# Patient Record
Sex: Female | Born: 1985
Health system: Southern US, Community
[De-identification: ages and names within clinical notes are randomized; demographics above are authoritative.]

## PROBLEM LIST (undated history)

## (undated) DIAGNOSIS — R519 Headache, unspecified: Secondary | ICD-10-CM

## (undated) DIAGNOSIS — R51 Headache: Secondary | ICD-10-CM

## (undated) DIAGNOSIS — O139 Gestational [pregnancy-induced] hypertension without significant proteinuria, unspecified trimester: Secondary | ICD-10-CM

## (undated) DIAGNOSIS — O24419 Gestational diabetes mellitus in pregnancy, unspecified control: Secondary | ICD-10-CM

## (undated) DIAGNOSIS — G43909 Migraine, unspecified, not intractable, without status migrainosus: Secondary | ICD-10-CM

## (undated) DIAGNOSIS — D649 Anemia, unspecified: Secondary | ICD-10-CM

## (undated) DIAGNOSIS — R569 Unspecified convulsions: Secondary | ICD-10-CM

## (undated) DIAGNOSIS — J45909 Unspecified asthma, uncomplicated: Secondary | ICD-10-CM

## (undated) HISTORY — DX: Migraine, unspecified, not intractable, without status migrainosus: G43.909

## (undated) HISTORY — DX: Gestational diabetes mellitus in pregnancy, unspecified control: O24.419

---
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
## 2006-01-23 ENCOUNTER — Ambulatory Visit: Payer: Self-pay | Admitting: Family Medicine

## 2006-01-26 ENCOUNTER — Other Ambulatory Visit: Payer: Self-pay

## 2006-01-26 ENCOUNTER — Emergency Department: Payer: Self-pay | Admitting: Emergency Medicine

## 2006-01-31 ENCOUNTER — Ambulatory Visit: Payer: Self-pay | Admitting: Psychiatry

## 2006-10-12 ENCOUNTER — Emergency Department: Payer: Self-pay | Admitting: Emergency Medicine

## 2006-11-06 ENCOUNTER — Encounter: Payer: Self-pay | Admitting: Obstetrics and Gynecology

## 2006-11-15 ENCOUNTER — Emergency Department: Payer: Self-pay | Admitting: Emergency Medicine

## 2006-11-29 ENCOUNTER — Observation Stay: Payer: Self-pay

## 2006-12-25 ENCOUNTER — Encounter: Payer: Self-pay | Admitting: Maternal & Fetal Medicine

## 2007-01-15 ENCOUNTER — Encounter: Payer: Self-pay | Admitting: Maternal & Fetal Medicine

## 2007-04-13 ENCOUNTER — Observation Stay: Payer: Self-pay | Admitting: Obstetrics & Gynecology

## 2007-05-19 ENCOUNTER — Inpatient Hospital Stay: Payer: Self-pay | Admitting: Obstetrics & Gynecology

## 2008-03-11 ENCOUNTER — Emergency Department: Payer: Self-pay | Admitting: Unknown Physician Specialty

## 2008-05-04 ENCOUNTER — Emergency Department: Payer: Self-pay | Admitting: Emergency Medicine

## 2013-08-11 ENCOUNTER — Emergency Department: Payer: Self-pay | Admitting: Emergency Medicine

## 2013-08-11 LAB — URINALYSIS, COMPLETE
Bilirubin,UR: NEGATIVE
Glucose,UR: NEGATIVE mg/dL (ref 0–75)
KETONE: NEGATIVE
Nitrite: NEGATIVE
Ph: 6 (ref 4.5–8.0)
Protein: NEGATIVE
SPECIFIC GRAVITY: 1.012 (ref 1.003–1.030)
Squamous Epithelial: 12
WBC UR: 11 /HPF (ref 0–5)

## 2013-08-11 LAB — CBC
HCT: 47.9 % — ABNORMAL HIGH (ref 35.0–47.0)
HGB: 15.8 g/dL (ref 12.0–16.0)
MCH: 28.8 pg (ref 26.0–34.0)
MCHC: 33 g/dL (ref 32.0–36.0)
MCV: 87 fL (ref 80–100)
Platelet: 335 10*3/uL (ref 150–440)
RBC: 5.48 10*6/uL — ABNORMAL HIGH (ref 3.80–5.20)
RDW: 13.6 % (ref 11.5–14.5)
WBC: 14.6 10*3/uL — ABNORMAL HIGH (ref 3.6–11.0)

## 2013-08-11 LAB — COMPREHENSIVE METABOLIC PANEL
AST: 26 U/L (ref 15–37)
Albumin: 4.1 g/dL (ref 3.4–5.0)
Alkaline Phosphatase: 96 U/L
Anion Gap: 3 — ABNORMAL LOW (ref 7–16)
BILIRUBIN TOTAL: 0.3 mg/dL (ref 0.2–1.0)
BUN: 7 mg/dL (ref 7–18)
CO2: 29 mmol/L (ref 21–32)
Calcium, Total: 9.3 mg/dL (ref 8.5–10.1)
Chloride: 106 mmol/L (ref 98–107)
Creatinine: 0.91 mg/dL (ref 0.60–1.30)
EGFR (Non-African Amer.): >60
Glucose: 72 mg/dL (ref 65–99)
Osmolality: 272 (ref 275–301)
POTASSIUM: 3.5 mmol/L (ref 3.5–5.1)
SGPT (ALT): 15 U/L (ref 12–78)
Sodium: 138 mmol/L (ref 136–145)
Total Protein: 8 g/dL (ref 6.4–8.2)

## 2013-08-11 LAB — CK TOTAL AND CKMB (NOT AT ARMC)
CK, Total: 69 U/L
CK-MB: <0.5 ng/mL — ABNORMAL LOW (ref 0.5–3.6)

## 2013-08-11 LAB — TROPONIN I

## 2014-04-15 ENCOUNTER — Encounter (HOSPITAL_COMMUNITY): Payer: Self-pay | Admitting: Emergency Medicine

## 2014-04-15 ENCOUNTER — Emergency Department (HOSPITAL_COMMUNITY): Payer: No Typology Code available for payment source

## 2014-04-15 ENCOUNTER — Emergency Department (HOSPITAL_COMMUNITY)
Admission: EM | Admit: 2014-04-15 | Discharge: 2014-04-15 | Disposition: A | Discharge status: Home / Self Care | Payer: Self-pay | Attending: Emergency Medicine | Admitting: Emergency Medicine

## 2014-04-15 DIAGNOSIS — Y9389 Activity, other specified: Secondary | ICD-10-CM | POA: Insufficient documentation

## 2014-04-15 DIAGNOSIS — Z720 Tobacco use: Secondary | ICD-10-CM | POA: Insufficient documentation

## 2014-04-15 DIAGNOSIS — Y92410 Unspecified street and highway as the place of occurrence of the external cause: Secondary | ICD-10-CM | POA: Insufficient documentation

## 2014-04-15 DIAGNOSIS — S0083XA Contusion of other part of head, initial encounter: Secondary | ICD-10-CM | POA: Insufficient documentation

## 2014-04-15 DIAGNOSIS — S161XXA Strain of muscle, fascia and tendon at neck level, initial encounter: Secondary | ICD-10-CM | POA: Insufficient documentation

## 2014-04-15 DIAGNOSIS — J45909 Unspecified asthma, uncomplicated: Secondary | ICD-10-CM | POA: Insufficient documentation

## 2014-04-15 DIAGNOSIS — Y998 Other external cause status: Secondary | ICD-10-CM | POA: Insufficient documentation

## 2014-04-15 HISTORY — DX: Unspecified convulsions: R56.9

## 2014-04-15 HISTORY — DX: Unspecified asthma, uncomplicated: J45.909

## 2014-04-15 MED ORDER — NAPROXEN 500 MG PO TABS
500.0000 mg | ORAL_TABLET | Freq: Two times a day (BID) | ORAL | Status: DC
Start: 2014-04-15 — End: 2014-07-12

## 2014-04-15 MED ORDER — CYCLOBENZAPRINE HCL 10 MG PO TABS: 10.0000 mg | ORAL_TABLET | Freq: Two times a day (BID) | ORAL | Status: DC | PRN

## 2014-04-15 MED ORDER — TRAMADOL HCL 50 MG PO TABS: 50.0000 mg | ORAL_TABLET | Freq: Four times a day (QID) | ORAL | Status: DC | PRN
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
## 2014-07-12 ENCOUNTER — Encounter: Payer: Self-pay | Admitting: Emergency Medicine

## 2014-07-12 ENCOUNTER — Emergency Department
Admission: EM | Admit: 2014-07-12 | Discharge: 2014-07-12 | Disposition: A | Discharge status: Home / Self Care | Payer: 59 | Attending: Emergency Medicine | Admitting: Emergency Medicine

## 2014-07-12 DIAGNOSIS — W57XXXA Bitten or stung by nonvenomous insect and other nonvenomous arthropods, initial encounter: Secondary | ICD-10-CM | POA: Diagnosis not present

## 2014-07-12 DIAGNOSIS — L089 Local infection of the skin and subcutaneous tissue, unspecified: Secondary | ICD-10-CM | POA: Insufficient documentation

## 2014-07-12 DIAGNOSIS — Z720 Tobacco use: Secondary | ICD-10-CM | POA: Diagnosis not present

## 2014-07-12 DIAGNOSIS — Y9389 Activity, other specified: Secondary | ICD-10-CM | POA: Insufficient documentation

## 2014-07-12 DIAGNOSIS — S50861A Insect bite (nonvenomous) of right forearm, initial encounter: Secondary | ICD-10-CM | POA: Insufficient documentation

## 2014-07-12 DIAGNOSIS — Y9289 Other specified places as the place of occurrence of the external cause: Secondary | ICD-10-CM | POA: Insufficient documentation

## 2014-07-12 DIAGNOSIS — Y998 Other external cause status: Secondary | ICD-10-CM | POA: Insufficient documentation

## 2014-07-12 MED ORDER — DOXYCYCLINE HYCLATE 100 MG PO TABS
100.0000 mg | ORAL_TABLET | Freq: Two times a day (BID) | ORAL | Status: DC
  Administered 2014-07-12: 100 mg via ORAL
  Filled 2014-07-12 (×3): qty 1

## 2014-07-12 MED ORDER — TRIAMCINOLONE ACETONIDE 0.5 % EX OINT: 1.0000 "application " | TOPICAL_OINTMENT | Freq: Two times a day (BID) | CUTANEOUS | Status: DC

## 2014-07-12 MED ORDER — DOXYCYCLINE HYCLATE 100 MG PO TBEC: 100.0000 mg | DELAYED_RELEASE_TABLET | Freq: Two times a day (BID) | ORAL | Status: DC
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
## 2014-07-12 NOTE — ED Provider Notes (Signed)
[facility] Emergency Department Provider Note    ____________________________________________  Time seen: 2023  I have reviewed the triage vital signs and the nursing notes.   HISTORY  Chief Complaint Insect Bite  HPI Yvonne Reid is a 29 y.o. female who presents with a possible insect bite to the right forearm. She states that the area has become more swollen in the past 48 hours. She also has noticed that there is some redness that seems to be spreading. She is not aware of what caused the symptoms.     Past Medical History  Diagnosis Date  . Seizures     PSEUDOSEIZURES  . Asthma     There are no active problems to display for this patient.   History reviewed. No pertinent past surgical history.  Current Outpatient Rx  Name  Route  Sig  Dispense  Refill  . doxycycline (DORYX) 100 MG EC tablet   Oral   Take 1 tablet (100 mg total) by mouth 2 (two) times daily.   20 tablet   0   . triamcinolone ointment (KENALOG) 0.5 %   Topical   Apply 1 application topically 2 (two) times daily.   30 g   0     Allergies Review of patient's allergies indicates no known allergies.  History reviewed. No pertinent family history.  Social History History  Substance Use Topics  . Smoking status: Current Every Day Smoker  . Smokeless tobacco: Not on file  . Alcohol Use: Yes    Review of Systems  Constitutional: Negative for fever. Eyes: Negative for visual changes. ENT: Negative for sore throat. Cardiovascular: Negative for chest pain. Respiratory: Negative for shortness of breath. Gastrointestinal: Negative for abdominal pain, vomiting and diarrhea. Skin: Negative for rash. Neurological: Negative for headaches, focal weakness or numbness.  10-point ROS otherwise negative.  ____________________________________________   PHYSICAL EXAM:  VITAL SIGNS: ED Triage Vitals  Enc Vitals Group     BP 07/12/14 1852 137/79 mmHg     Pulse  Rate 07/12/14 1852 87     Resp 07/12/14 1852 20     Temp 07/12/14 1852 98 F (36.7 C)     Temp Source 07/12/14 1852 Oral     SpO2 07/12/14 2124 99 %     Weight --      Height --      Head Cir --      Peak Flow --      Pain Score 07/12/14 2126 5     Pain Loc --      Pain Edu? --      Excl. in GC? --     Constitutional: Alert and oriented. Well appearing and in no distress. Eyes: Conjunctivae are normal. PERRL. Normal extraocular movements. ENT   Head: Normocephalic and atraumatic.   Nose: No congestion/rhinnorhea.   Mouth/Throat: Mucous membranes are moist.   Neck: No stridor. Hematological/Lymphatic/Immunilogical: No cervical lymphadenopathy. Respiratory: Normal respiratory effort without tachypnea nor retractions.  Musculoskeletal: Nontender with normal range of motion in all extremities.  Neurologic:  Normal speech and language. No gross focal neurologic deficits are appreciated. Speech is normal. No gait instability. Skin:  Skin is warm, dry and intact. No rash noted. Approximately 2 cm erythematous area noted on the right mid forearm with early lymphangitis. Psychiatric: Mood and affect are normal. Speech and behavior are normal. Patient exhibits appropriate insight and judgment.  ____________________________________________    LABS (pertinent positives/negatives)    ____________________________________________   EKG  ____________________________________________    RADIOLOGY    ____________________________________________   PROCEDURES  Procedure(s) performed: None  Critical Care performed: No  ____________________________________________   INITIAL IMPRESSION / ASSESSMENT AND PLAN / ED COURSE  Pertinent labs & imaging results that were available during my care of the patient were reviewed by me and considered in my medical decision making (see chart for details).  Patient was advised to follow up with her primary care provider or  return to the emergency department if no improvement over the next 2 days, or sooner if her symptoms that change or worsen. She was advised to return immediately for fever, headache, or body aches.  ____________________________________________   FINAL CLINICAL IMPRESSION(S) / ED DIAGNOSES  Final diagnoses:  Insect bite of forearm, infected, right, initial encounter     Chinita PesterCari B Jessina Marse, FNP 07/12/14 2133  Loleta Roseory Forbach, MD 07/12/14 [phone]

## 2015-04-04 ENCOUNTER — Emergency Department
Admission: EM | Admit: 2015-04-04 | Discharge: 2015-04-05 | Disposition: A | Discharge status: Home / Self Care | Payer: 59 | Attending: Emergency Medicine | Admitting: Emergency Medicine

## 2015-04-04 ENCOUNTER — Encounter: Payer: Self-pay | Admitting: *Deleted

## 2015-04-04 DIAGNOSIS — Z792 Long term (current) use of antibiotics: Secondary | ICD-10-CM | POA: Insufficient documentation

## 2015-04-04 DIAGNOSIS — R51 Headache: Secondary | ICD-10-CM | POA: Diagnosis present

## 2015-04-04 DIAGNOSIS — G43009 Migraine without aura, not intractable, without status migrainosus: Secondary | ICD-10-CM

## 2015-04-04 DIAGNOSIS — F17200 Nicotine dependence, unspecified, uncomplicated: Secondary | ICD-10-CM | POA: Diagnosis not present

## 2015-04-04 DIAGNOSIS — G43909 Migraine, unspecified, not intractable, without status migrainosus: Secondary | ICD-10-CM | POA: Insufficient documentation

## 2015-04-04 DIAGNOSIS — Z7952 Long term (current) use of systemic steroids: Secondary | ICD-10-CM | POA: Diagnosis not present

## 2015-04-04 MED ORDER — DIPHENHYDRAMINE HCL 50 MG/ML IJ SOLN
25.0000 mg | Freq: Once | INTRAMUSCULAR | Status: AC
  Administered 2015-04-04: 25 mg via INTRAVENOUS
  Filled 2015-04-04: qty 1

## 2015-04-04 MED ORDER — SODIUM CHLORIDE 0.9 % IV BOLUS (SEPSIS)
1000.0000 mL | Freq: Once | INTRAVENOUS | Status: AC
  Administered 2015-04-04: 1000 mL via INTRAVENOUS

## 2015-04-04 MED ORDER — METOCLOPRAMIDE HCL 5 MG/ML IJ SOLN
10.0000 mg | Freq: Once | INTRAMUSCULAR | Status: AC
  Administered 2015-04-04: 10 mg via INTRAVENOUS
  Filled 2015-04-04: qty 2

## 2015-04-04 NOTE — ED Notes (Signed)
MD at bedside. 

## 2015-04-04 NOTE — ED Notes (Addendum)
Pt has a headache for 1 week.  Pt reports nausea/vomiting x 3 today.  Pt taking otc meds without relief.  Pt alert. Speech clear.

## 2015-04-04 NOTE — ED Provider Notes (Signed)
[facility] Emergency Department Provider Note  ____________________________________________  Time seen: Approximately 11:27 PM  I have reviewed the triage vital signs and the nursing notes.   HISTORY  Chief Complaint Headache    HPI Yvonne Reid is a 30 y.o. female who comes into the hospital today with a migraine. The patient reports that she does have a history of migraines. This headache started last Tuesday. The patient has been taking Excedrin and ibuprofen and trying to sleep which typically works but this time it has not. The patient reports that she gets headaches 4-5 times a year and has not had one this intense in a long time. The patient reports her headache is a 7-8 out of 10 in intensity and she is sensitive to light and sound. The patient has had some nausea and vomiting. Her last menstrual period was 3 weeks ago. She endorses some blurred vision and pain in her temples and the front of her head. She reports that in quality as his typical of her migraines but they typically don't last this long. The patient came in for treatment for her headache.   Past Medical History  Diagnosis Date  . Seizures (HCC)     PSEUDOSEIZURES  . Asthma     There are no active problems to display for this patient.   No past surgical history on file.  Current Outpatient Rx  Name  Route  Sig  Dispense  Refill  . butalbital-acetaminophen-caffeine (FIORICET) 50-325-40 MG tablet   Oral   Take 1-2 tablets by mouth every 6 (six) hours as needed for headache.   20 tablet   0   . doxycycline (DORYX) 100 MG EC tablet   Oral   Take 1 tablet (100 mg total) by mouth 2 (two) times daily.   20 tablet   0   . triamcinolone ointment (KENALOG) 0.5 %   Topical   Apply 1 application topically 2 (two) times daily.   30 g   0     Allergies Review of patient's allergies indicates no known allergies.  No family history on file.  Social History Social History   Substance Use Topics  . Smoking status: Current Every Day Smoker  . Smokeless tobacco: None  . Alcohol Use: Yes    Review of Systems Constitutional: No fever/chills Eyes: Blurred vision ENT: No sore throat. Cardiovascular: Denies chest pain. Respiratory: Denies shortness of breath. Gastrointestinal: No abdominal pain.  No nausea, no vomiting.  No diarrhea.  No constipation. Genitourinary: Negative for dysuria. Musculoskeletal: Negative for back pain. Skin: Negative for rash. Neurological: Headache  10-point ROS otherwise negative.  ____________________________________________   PHYSICAL EXAM:  VITAL SIGNS: ED Triage Vitals  Enc Vitals Group     BP 04/04/15 2153 149/85 mmHg     Pulse Rate 04/04/15 2153 96     Resp 04/04/15 2153 22     Temp 04/04/15 2153 97.9 F (36.6 C)     Temp Source 04/04/15 2153 Oral     SpO2 04/04/15 2153 99 %     Weight 04/04/15 2153 190 lb (86.183 kg)     Height 04/04/15 2153  (1.549 m)     Head Cir --      Peak Flow --      Pain Score 04/04/15 2154 6     Pain Loc --      Pain Edu? --      Excl. in GC? --     Constitutional: Alert and  oriented. Well appearing and in moderate distress. Eyes: Conjunctivae are normal. PERRL. EOMI. Head: Atraumatic. Nose: No congestion/rhinnorhea. Mouth/Throat: Mucous membranes are moist.  Oropharynx non-erythematous. Cardiovascular: Normal rate, regular rhythm. Grossly normal heart sounds.  Good peripheral circulation. Respiratory: Normal respiratory effort.  No retractions. Lungs CTAB. Gastrointestinal: Soft and nontender. No distention. Positive bowel sounds Musculoskeletal: No lower extremity tenderness nor edema.   Neurologic:  Normal speech and language. No gross focal neurologic deficits are appreciated.  Skin:  Skin is warm, dry and intact.  Psychiatric: Mood and affect are normal.   ____________________________________________   LABS (all labs ordered are listed, but only abnormal results  are displayed)  Labs Reviewed  POC URINE PREG, ED   ____________________________________________  EKG  None ____________________________________________  RADIOLOGY  none ____________________________________________   PROCEDURES  Procedure(s) performed: None  Critical Care performed: No  ____________________________________________   INITIAL IMPRESSION / ASSESSMENT AND PLAN / ED COURSE  Pertinent labs & imaging results that were available during my care of the patient were reviewed by me and considered in my medical decision making (see chart for details).  This is a 30 year old female with a history of migraines who comes into the hospital today with a headache 1 week. The patient does seem uncomfortable and has a blanket covering her face to protect her eyes from the light. I will give the patient a liter of normal saline as well as some Reglan and Benadryl and I will reassess the patient in 30 minutes.  ----------------------------------------- 1:05 AM on 04/05/2015 -----------------------------------------  The patient reports her headache is mildly improved but still intense so I will give her a dose of Toradol and Fioricet.  ----------------------------------------- 2:48 AM on 04/05/2015 -----------------------------------------  The headache is much improved at this time. The patient be discharged home to follow-up with the acute care clinic. ____________________________________________   FINAL CLINICAL IMPRESSION(S) / ED DIAGNOSES  Final diagnoses:  Nonintractable migraine, unspecified migraine type      Rebecka Apley, MD 04/05/15 [phone]

## 2015-04-05 MED ORDER — KETOROLAC TROMETHAMINE 30 MG/ML IJ SOLN
30.0000 mg | Freq: Once | INTRAMUSCULAR | Status: AC
  Administered 2015-04-05: 30 mg via INTRAVENOUS
  Filled 2015-04-05: qty 1

## 2015-04-05 MED ORDER — SODIUM CHLORIDE 0.9 % IV BOLUS (SEPSIS)
1000.0000 mL | Freq: Once | INTRAVENOUS | Status: AC
  Administered 2015-04-05: 1000 mL via INTRAVENOUS

## 2015-04-05 MED ORDER — BUTALBITAL-APAP-CAFFEINE 50-325-40 MG PO TABS: 1.0000 | ORAL_TABLET | Freq: Four times a day (QID) | ORAL | Status: DC | PRN

## 2015-04-05 MED ORDER — BUTALBITAL-APAP-CAFFEINE 50-325-40 MG PO TABS
2.0000 | ORAL_TABLET | Freq: Once | ORAL | Status: AC
  Administered 2015-04-05: 2 via ORAL
  Filled 2015-04-05: qty 2

## 2015-04-05 NOTE — ED Notes (Signed)

## 2015-04-05 NOTE — Discharge Instructions (Signed)
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
## 2015-08-23 ENCOUNTER — Emergency Department
Admission: EM | Admit: 2015-08-23 | Discharge: 2015-08-23 | Disposition: A | Discharge status: Home / Self Care | Payer: 59 | Attending: Emergency Medicine | Admitting: Emergency Medicine

## 2015-08-23 ENCOUNTER — Encounter: Payer: Self-pay | Admitting: Emergency Medicine

## 2015-08-23 DIAGNOSIS — S40861A Insect bite (nonvenomous) of right upper arm, initial encounter: Secondary | ICD-10-CM | POA: Diagnosis present

## 2015-08-23 DIAGNOSIS — Y999 Unspecified external cause status: Secondary | ICD-10-CM | POA: Insufficient documentation

## 2015-08-23 DIAGNOSIS — Y929 Unspecified place or not applicable: Secondary | ICD-10-CM | POA: Insufficient documentation

## 2015-08-23 DIAGNOSIS — F17210 Nicotine dependence, cigarettes, uncomplicated: Secondary | ICD-10-CM | POA: Insufficient documentation

## 2015-08-23 DIAGNOSIS — Z8669 Personal history of other diseases of the nervous system and sense organs: Secondary | ICD-10-CM | POA: Insufficient documentation

## 2015-08-23 DIAGNOSIS — J45909 Unspecified asthma, uncomplicated: Secondary | ICD-10-CM | POA: Diagnosis not present

## 2015-08-23 DIAGNOSIS — L03113 Cellulitis of right upper limb: Secondary | ICD-10-CM | POA: Diagnosis not present

## 2015-08-23 DIAGNOSIS — W57XXXA Bitten or stung by nonvenomous insect and other nonvenomous arthropods, initial encounter: Secondary | ICD-10-CM | POA: Diagnosis not present

## 2015-08-23 DIAGNOSIS — T63301A Toxic effect of unspecified spider venom, accidental (unintentional), initial encounter: Secondary | ICD-10-CM

## 2015-08-23 DIAGNOSIS — Y939 Activity, unspecified: Secondary | ICD-10-CM | POA: Insufficient documentation

## 2015-08-23 LAB — BASIC METABOLIC PANEL
ANION GAP: 7 (ref 5–15)
BUN: 9 mg/dL (ref 6–20)
CHLORIDE: 108 mmol/L (ref 101–111)
CO2: 21 mmol/L — AB (ref 22–32)
Calcium: 9 mg/dL (ref 8.9–10.3)
Creatinine, Ser: 0.69 mg/dL (ref 0.44–1.00)
GFR calc Af Amer: >60 mL/min (ref >60)
GFR calc non Af Amer: >60 mL/min (ref >60)
Glucose, Bld: 102 mg/dL — ABNORMAL HIGH (ref 65–99)
Potassium: 3.5 mmol/L (ref 3.5–5.1)
Sodium: 136 mmol/L (ref 135–145)

## 2015-08-23 LAB — CBC WITH DIFFERENTIAL/PLATELET
BASOS ABS: 0 10*3/uL (ref 0–0.1)
Basophils Relative: 0 %
Eosinophils Absolute: 0.8 10*3/uL — ABNORMAL HIGH (ref 0–0.7)
Eosinophils Relative: 6 %
HEMATOCRIT: 40.8 % (ref 35.0–47.0)
Hemoglobin: 13.8 g/dL (ref 12.0–16.0)
Lymphs Abs: 2.6 10*3/uL (ref 1.0–3.6)
MCH: 28.6 pg (ref 26.0–34.0)
MCHC: 34 g/dL (ref 32.0–36.0)
MCV: 84.2 fL (ref 80.0–100.0)
Monocytes Absolute: 1.1 10*3/uL — ABNORMAL HIGH (ref 0.2–0.9)
Neutro Abs: 8.7 10*3/uL — ABNORMAL HIGH (ref 1.4–6.5)
Neutrophils Relative %: 66 %
Platelets: 284 10*3/uL (ref 150–440)
RBC: 4.84 MIL/uL (ref 3.80–5.20)
RDW: 13.2 % (ref 11.5–14.5)
WBC: 13.2 10*3/uL — ABNORMAL HIGH (ref 3.6–11.0)

## 2015-08-23 MED ORDER — CLINDAMYCIN PHOSPHATE 600 MG/50ML IV SOLN
600.0000 mg | Freq: Once | INTRAVENOUS | Status: AC
  Administered 2015-08-23: 600 mg via INTRAVENOUS
  Filled 2015-08-23: qty 50

## 2015-08-23 MED ORDER — CLINDAMYCIN HCL 300 MG PO CAPS: 300.0000 mg | ORAL_CAPSULE | Freq: Three times a day (TID) | ORAL | Status: DC

## 2015-08-23 MED ORDER — IBUPROFEN 800 MG PO TABS
800.0000 mg | ORAL_TABLET | Freq: Once | ORAL | Status: AC
  Administered 2015-08-23: 800 mg via ORAL
  Filled 2015-08-23: qty 1

## 2015-08-23 MED ORDER — TETANUS-DIPHTH-ACELL PERTUSSIS 5-2.5-18.5 LF-MCG/0.5 IM SUSP
0.5000 mL | Freq: Once | INTRAMUSCULAR | Status: AC
  Administered 2015-08-23: 0.5 mL via INTRAMUSCULAR
  Filled 2015-08-23: qty 0.5

## 2015-08-23 MED ORDER — HYDROCODONE-ACETAMINOPHEN 5-325 MG PO TABS: 1.0000 | ORAL_TABLET | Freq: Four times a day (QID) | ORAL | Status: DC | PRN

## 2015-08-23 MED ORDER — SODIUM CHLORIDE 0.9 % IV BOLUS (SEPSIS)
1000.0000 mL | Freq: Once | INTRAVENOUS | Status: AC
  Administered 2015-08-23: 1000 mL via INTRAVENOUS

## 2015-08-23 NOTE — ED Notes (Signed)
Some kind of bite on left shoulder.  Now area is open and redness around area--painful

## 2015-08-23 NOTE — ED Notes (Signed)
States possible insect bite to right upper arm yesterday  Area is red and swollen with some drainage

## 2015-08-23 NOTE — ED Provider Notes (Signed)
[facility] Emergency Department Provider Note  ____________________________________________  Time seen: Approximately 11:10 AM  I have reviewed the triage vital signs and the nursing notes.   HISTORY  Chief Complaint Insect Bite and Wound Infection    HPI Yvonne Reid is a 30 y.o. female , NAD, presents to the emergency department with one-day history of insect bite to the right upper arm. States she noticed the area yesterday and it looked like a pimple. Does not remember being bitten by anything. States she woke this morning in her right upper arm was significantly painful and tender to touch. Also notes the wound grew to the size of a quarter and was open and oozing. Denies any fevers, chills, body aches. Has not had any abdominal pain, nausea, vomiting. Has had no difficulty breathing, chest pain, wheezing, shortness of breath. No swelling about any other part of the body. Denies any numbness, weakness, tingling.   Past Medical History  Diagnosis Date  . Seizures (HCC)     PSEUDOSEIZURES  . Asthma     There are no active problems to display for this patient.   History reviewed. No pertinent past surgical history.  Current Outpatient Rx  Name  Route  Sig  Dispense  Refill  . clindamycin (CLEOCIN) 300 MG capsule   Oral   Take 1 capsule (300 mg total) by mouth 3 (three) times daily.   30 capsule   0   . HYDROcodone-acetaminophen (NORCO) 5-325 MG tablet   Oral   Take 1 tablet by mouth every 6 (six) hours as needed for severe pain.   6 tablet   0     Allergies Review of patient's allergies indicates no known allergies.  No family history on file.  Social History Social History  Substance Use Topics  . Smoking status: Current Every Day Smoker  . Smokeless tobacco: None  . Alcohol Use: No     Review of Systems  Constitutional: No fever/chills, Fatigue Eyes: No visual changes. No discharge, Swelling, redness ENT: No sore  throat, Swelling about throat/tongue/lips. Cardiovascular: No chest pain. Respiratory: No cough. No shortness of breath. No wheezing.  Gastrointestinal: No abdominal pain.  No nausea, vomiting.  No diarrhea.  Musculoskeletal: Negative for General myalgias.  Skin: Positive skin sore, swelling, redness to the right upper arm. Negative for rash. Neurological: Negative for headaches, focal weakness or numbness. No tingling. 10-point ROS otherwise negative.  ____________________________________________   PHYSICAL EXAM:  VITAL SIGNS: ED Triage Vitals  Enc Vitals Group     BP 08/23/15 1012 135/88 mmHg     Pulse Rate 08/23/15 1012 105     Resp 08/23/15 1012 14     Temp 08/23/15 1012 98.5 F (36.9 C)     Temp Source 08/23/15 1012 Oral     SpO2 08/23/15 1012 98 %     Weight 08/23/15 1012 200 lb (90.719 kg)     Height 08/23/15 1012 5\' 1"  (1.549 m)     Head Cir --      Peak Flow --      Pain Score 08/23/15 1013 7     Pain Loc --      Pain Edu? --      Excl. in GC? --      Constitutional: Alert and oriented. Well appearing and in no acute distress. Eyes: Conjunctivae are normal.  Head: Atraumatic. Neck: Supple with full range of motion. No stridor. Hematological/Lymphatic/Immunilogical: No cervical lymphadenopathy. Cardiovascular:   Regular rate and  rhythm. Grossly normal heart sounds. Good peripheral circulation with 2+ pulses noted in the right upper extremity. Capillary refill is brisk in the right upper extremity Respiratory: Normal respiratory effort without tachypnea or retractions. Lungs CTAB with breath sounds noted in all lung fields. Musculoskeletal: No tenderness to palpation about the right shoulder, elbow, wrist, hands. Full range of motion of the right upper extremity but with pain about the right upper arm at approximately 120 abduction. Neurologic:  Normal speech and language. No gross focal neurologic deficits are appreciated.  Skin:  2 cm annular, ulcerating lesion  noted about the right, lateral upper arm with clear/yellow oozing and weeping. Surrounding erythema is indurated and warm to touch and spans approximately 3/4 of the upper arm. Right elbow, forearm, wrist, hand, fingers are spared of any erythema or induration. Right shoulder is also spared of any erythema or induration.  Skin is warm, dry and intact.  Psychiatric: Mood and affect are normal. Speech and behavior are normal. Patient exhibits appropriate insight and judgement.   ____________________________________________   LABS (all labs ordered are listed, but only abnormal results are displayed)  Labs Reviewed  CBC WITH DIFFERENTIAL/PLATELET - Abnormal; Notable for the following:    WBC 13.2 (*)    Neutro Abs 8.7 (*)    Monocytes Absolute 1.1 (*)    Eosinophils Absolute 0.8 (*)    All other components within normal limits  BASIC METABOLIC PANEL - Abnormal; Notable for the following:    CO2 21 (*)    Glucose, Bld 102 (*)    All other components within normal limits   ____________________________________________  EKG  None ____________________________________________  RADIOLOGY  None ____________________________________________    PROCEDURES  Procedure(s) performed: None    Medications  clindamycin (CLEOCIN) IVPB 600 mg (0 mg Intravenous Stopped 08/23/15 1249)  sodium chloride 0.9 % bolus 1,000 mL (1,000 mLs Intravenous New Bag/Given 08/23/15 1129)  ibuprofen (ADVIL,MOTRIN) tablet 800 mg (800 mg Oral Given 08/23/15 1130)  Tdap (BOOSTRIX) injection 0.5 mL (0.5 mLs Intramuscular Given 08/23/15 1258)    ----------------------------------------- 12:36 PM on 08/23/2015 -----------------------------------------  Patient has tolerated IV clindamycin well without any side effects. She is about halfway through her IV saline and we will recheck on her about 30 minutes.   ____________________________________________   INITIAL IMPRESSION / ASSESSMENT AND PLAN / ED  COURSE  Pertinent lab results that were available during my care of the patient were reviewed by me and considered in my medical decision making (see chart for details).  Patient's diagnosis is consistent with Cellulitis right upper arm caused by a spider bite. Patient tolerated IV antibiotics and fluids well without any side effects. Her tetanus vaccination was updated during her ED course. Patient will be discharged home with prescriptions for clindamycin and Norco to take as directed. Patient may take Benadryl as needed for itching. Patient is to keep wound on the right upper arm clean and dry while healing. Blue permanent marker was used to outline the area of cellulitis for the patient have a follow-up with her primary care provider or [facility] clinic [facility] within 48 hours for wound recheck. Patient was given a work note to excuse from work today and tomorrow to allow healing and for her to take her medications Patient is given strict ED precautions to return to the ED for any worsening or new symptoms.      ____________________________________________  FINAL CLINICAL IMPRESSION(S) / ED DIAGNOSES  Final diagnoses:  Cellulitis of right upper arm  Spider bite, accidental  or unintentional, initial encounter      NEW MEDICATIONS STARTED DURING THIS VISIT:  New Prescriptions   CLINDAMYCIN (CLEOCIN) 300 MG CAPSULE    Take 1 capsule (300 mg total) by mouth 3 (three) times daily.   HYDROCODONE-ACETAMINOPHEN (NORCO) 5-325 MG TABLET    Take 1 tablet by mouth every 6 (six) hours as needed for severe pain.         Hope Pigeon, PA-C 08/23/15 1339  Governor Rooks, MD 08/23/15 1424

## 2015-08-23 NOTE — Discharge Instructions (Signed)
Cellulitis Cellulitis is an infection of the skin and the tissue beneath it. The infected area is usually red and tender. Cellulitis occurs most often in the arms and lower legs.  CAUSES  Cellulitis is caused by bacteria that enter the skin through cracks or cuts in the skin. The most common types of bacteria that cause cellulitis are staphylococci and streptococci. SIGNS AND SYMPTOMS   Redness and warmth.  Swelling.  Tenderness or pain.  Fever. DIAGNOSIS  Your health care provider can usually determine what is wrong based on a physical exam. Blood tests may also be done. TREATMENT  Treatment usually involves taking an antibiotic medicine. HOME CARE INSTRUCTIONS   Take your antibiotic medicine as directed by your health care provider. Finish the antibiotic even if you start to feel better.  Keep the infected arm or leg elevated to reduce swelling.  Apply a warm cloth to the affected area up to 4 times per day to relieve pain.  Take medicines only as directed by your health care provider.  Keep all follow-up visits as directed by your health care provider. SEEK MEDICAL CARE IF:   You notice red streaks coming from the infected area.  Your red area gets larger or turns dark in color.  Your bone or joint underneath the infected area becomes painful after the skin has healed.  Your infection returns in the same area or another area.  You notice a swollen bump in the infected area.  You develop new symptoms.  You have a fever. SEEK IMMEDIATE MEDICAL CARE IF:   You feel very sleepy.  You develop vomiting or diarrhea.  You have a general ill feeling (malaise) with muscle aches and pains.   This information is not intended to replace advice given to you by your health care provider. Make sure you discuss any questions you have with your health care provider.   Document Released: 12/05/2004 Document Revised: 11/16/2014 Document Reviewed: 05/13/2011 Elsevier Interactive  Patient Education 2016 [employer].  Spider Bite Spider bites are not common. When spider bites do happen, most do not cause serious health problems. There are only a few types of spider bites that can cause serious health problems. CAUSES A spider bite usually happens when a person accidentally makes contact with a spider in a way that traps the spider against the person's skin. SYMPTOMS Symptoms may vary depending on the type of spider. Some spider bites may cause symptoms within 1 hour after the bite. For other spider bites, it may take 1-2 days for symptoms to develop. Common symptoms include:  Redness and swelling in the area of the bite.  Discomfort or pain in the area of the bite. A few types of spiders, such as the black widow spider or the brown recluse spider, can inject poison (venom) into a bite wound. This venom causes more serious symptoms. Symptoms of a venomous spider bite vary, and may include:  Muscle cramps.  Nausea, vomiting, or abdominal pain.  Fever.  A skin sore (lesion) that spreads. This can break into an open wound (skin ulcer).  Light-headedness or dizziness. DIAGNOSIS This condition may be diagnosed based on your symptoms and a physical exam. Your health care provider will ask about the history of your injury and any details you may have about the spider. This may help to determine what type of spider it was that bit you. TREATMENT Many spider bites do not require treatment. If needed, treatment may include:  Icing and keeping the bite  area raised (elevated).  Over-the-counter or prescription medicines to help control symptoms.  A tetanus shot.  Antibiotic medicines. HOME CARE INSTRUCTIONS Medicines  Take or apply over-the-counter and prescription medicines only as told by your health care provider.  If you were prescribed an antibiotic medicine, take or apply it as told by your health care provider. Do not stop using the antibiotic even if your  condition improves. General Instructions  Do not scratch the bite area.  Keep the bite area clean and dry. Wash the bite area daily with soap and water as told by your health care provider.  If directed, apply ice to the bite area.  Put ice in a plastic bag.  Place a towel between your skin and the bag.  Leave the ice on for 20 minutes, 2-3 times per day.  Elevate the affected area above the level of your heart while you are sitting or lying down, if possible.  Keep all follow-up visits as told by your health care provider. This is important. SEEK MEDICAL CARE IF:  Your bite does not get better after 3 days of treatment.  Your bite turns black or purple.  You have increased redness, swelling, or pain at the site of the bite. SEEK IMMEDIATE MEDICAL CARE IF:  You develop shortness of breath or chest pain.  You have fluid, blood, or pus coming from the bite area.  You have muscle cramps or painful muscle spasms.  You develop abdominal pain, nausea, or vomiting.  You feel unusually tired (fatigued) or sleepy.   This information is not intended to replace advice given to you by your health care provider. Make sure you discuss any questions you have with your health care provider.   Document Released: 04/04/2004 Document Revised: 11/16/2014 Document Reviewed: 07/13/2014 Elsevier Interactive Patient Education [employer]2016 Elsevier Inc.

## 2015-08-24 ENCOUNTER — Encounter: Payer: Self-pay | Admitting: General Surgery

## 2015-08-24 ENCOUNTER — Observation Stay
Admission: RE | Admit: 2015-08-24 | Discharge: 2015-08-25 | Disposition: A | Discharge status: Home / Self Care | Payer: 59 | Source: Ambulatory Visit | Attending: General Surgery | Admitting: General Surgery

## 2015-08-24 ENCOUNTER — Ambulatory Visit: Payer: 59 | Admitting: Anesthesiology

## 2015-08-24 ENCOUNTER — Ambulatory Visit
Admission: RE | Admit: 2015-08-24 | Discharge: 2015-08-24 | Disposition: A | Discharge status: Home / Self Care | Payer: 59 | Source: Ambulatory Visit | Attending: General Surgery | Admitting: General Surgery

## 2015-08-24 ENCOUNTER — Ambulatory Visit (INDEPENDENT_AMBULATORY_CARE_PROVIDER_SITE_OTHER): Payer: 59 | Admitting: General Surgery

## 2015-08-24 ENCOUNTER — Encounter
Admission: RE | Disposition: A | Discharge status: Home / Self Care | Payer: Self-pay | Source: Ambulatory Visit | Attending: General Surgery

## 2015-08-24 ENCOUNTER — Encounter: Payer: Self-pay | Admitting: *Deleted

## 2015-08-24 VITALS — BP 120/72 | HR 86 | Temp 98.8°F | Resp 14 | Ht 61.0 in | Wt 204.0 lb

## 2015-08-24 DIAGNOSIS — J45909 Unspecified asthma, uncomplicated: Secondary | ICD-10-CM | POA: Insufficient documentation

## 2015-08-24 DIAGNOSIS — R569 Unspecified convulsions: Secondary | ICD-10-CM | POA: Insufficient documentation

## 2015-08-24 DIAGNOSIS — Y998 Other external cause status: Secondary | ICD-10-CM | POA: Insufficient documentation

## 2015-08-24 DIAGNOSIS — L03113 Cellulitis of right upper limb: Principal | ICD-10-CM | POA: Diagnosis present

## 2015-08-24 DIAGNOSIS — S40861S Insect bite (nonvenomous) of right upper arm, sequela: Secondary | ICD-10-CM | POA: Diagnosis not present

## 2015-08-24 DIAGNOSIS — Y9389 Activity, other specified: Secondary | ICD-10-CM | POA: Insufficient documentation

## 2015-08-24 DIAGNOSIS — Y9289 Other specified places as the place of occurrence of the external cause: Secondary | ICD-10-CM | POA: Diagnosis not present

## 2015-08-24 DIAGNOSIS — W57XXXA Bitten or stung by nonvenomous insect and other nonvenomous arthropods, initial encounter: Secondary | ICD-10-CM | POA: Diagnosis not present

## 2015-08-24 DIAGNOSIS — S40861A Insect bite (nonvenomous) of right upper arm, initial encounter: Secondary | ICD-10-CM | POA: Diagnosis not present

## 2015-08-24 DIAGNOSIS — F17200 Nicotine dependence, unspecified, uncomplicated: Secondary | ICD-10-CM | POA: Insufficient documentation

## 2015-08-24 HISTORY — DX: Headache, unspecified: R51.9

## 2015-08-24 HISTORY — PX: I & D EXTREMITY: SHX5045

## 2015-08-24 HISTORY — DX: Headache: R51

## 2015-08-24 LAB — CBC WITH DIFFERENTIAL/PLATELET
Basophils Absolute: 0 10*3/uL (ref 0–0.1)
EOS ABS: 0.8 10*3/uL — AB (ref 0–0.7)
HCT: 42.5 % (ref 35.0–47.0)
Hemoglobin: 14 g/dL (ref 12.0–16.0)
Lymphocytes Relative: 20 %
Lymphs Abs: 2.7 10*3/uL (ref 1.0–3.6)
MCH: 28 pg (ref 26.0–34.0)
MCHC: 33 g/dL (ref 32.0–36.0)
MCV: 84.8 fL (ref 80.0–100.0)
MONO ABS: 1 10*3/uL — AB (ref 0.2–0.9)
Neutro Abs: 9 10*3/uL — ABNORMAL HIGH (ref 1.4–6.5)
Neutrophils Relative %: 67 %
Platelets: 295 10*3/uL (ref 150–440)
RBC: 5.01 MIL/uL (ref 3.80–5.20)
RDW: 13.2 % (ref 11.5–14.5)
WBC: 13.4 10*3/uL — ABNORMAL HIGH (ref 3.6–11.0)

## 2015-08-24 LAB — PREGNANCY, URINE: PREG TEST UR: NEGATIVE

## 2015-08-24 SURGERY — IRRIGATION AND DEBRIDEMENT EXTREMITY
Anesthesia: General | Site: Arm Upper | Laterality: Right | Wound class: Dirty or Infected

## 2015-08-24 MED ORDER — CEFAZOLIN SODIUM-DEXTROSE 2-4 GM/100ML-% IV SOLN
INTRAVENOUS | Status: AC
  Filled 2015-08-24: qty 100

## 2015-08-24 MED ORDER — CEFAZOLIN SODIUM-DEXTROSE 2-4 GM/100ML-% IV SOLN
2.0000 g | Freq: Three times a day (TID) | INTRAVENOUS | Status: DC
  Administered 2015-08-24 – 2015-08-25 (×2): 2 g via INTRAVENOUS
  Filled 2015-08-24 (×4): qty 100

## 2015-08-24 MED ORDER — PROPOFOL 10 MG/ML IV BOLUS
INTRAVENOUS | Status: DC | PRN
  Administered 2015-08-24: 50 mg via INTRAVENOUS
  Administered 2015-08-24: 150 mg via INTRAVENOUS

## 2015-08-24 MED ORDER — CEFAZOLIN SODIUM-DEXTROSE 2-4 GM/100ML-% IV SOLN
2.0000 g | INTRAVENOUS | Status: AC
  Administered 2015-08-24: 2 g via INTRAVENOUS

## 2015-08-24 MED ORDER — ONDANSETRON HCL 4 MG/2ML IJ SOLN
INTRAMUSCULAR | Status: DC | PRN
  Administered 2015-08-24: 4 mg via INTRAVENOUS

## 2015-08-24 MED ORDER — DEXTROSE-NACL 5-0.45 % IV SOLN
INTRAVENOUS | Status: DC
  Administered 2015-08-24: 20:00:00 via INTRAVENOUS

## 2015-08-24 MED ORDER — ONDANSETRON 8 MG PO TBDP: 4.0000 mg | ORAL_TABLET | Freq: Four times a day (QID) | ORAL | Status: DC | PRN

## 2015-08-24 MED ORDER — OXYCODONE HCL 5 MG/5ML PO SOLN: 5.0000 mg | Freq: Once | ORAL | Status: DC | PRN

## 2015-08-24 MED ORDER — MORPHINE SULFATE (PF) 2 MG/ML IV SOLN
2.0000 mg | INTRAVENOUS | Status: DC | PRN
  Administered 2015-08-24 – 2015-08-25 (×2): 2 mg via INTRAVENOUS
  Filled 2015-08-24 (×2): qty 1

## 2015-08-24 MED ORDER — LIDOCAINE-EPINEPHRINE (PF) 1 %-1:200000 IJ SOLN
INTRAMUSCULAR | Status: AC
  Filled 2015-08-24: qty 30

## 2015-08-24 MED ORDER — OXYCODONE HCL 5 MG PO TABS: 5.0000 mg | ORAL_TABLET | Freq: Once | ORAL | Status: DC | PRN

## 2015-08-24 MED ORDER — LACTATED RINGERS IV SOLN
INTRAVENOUS | Status: DC
  Administered 2015-08-24: 15:00:00 via INTRAVENOUS

## 2015-08-24 MED ORDER — ONDANSETRON HCL 4 MG/2ML IJ SOLN: 4.0000 mg | Freq: Four times a day (QID) | INTRAMUSCULAR | Status: DC | PRN

## 2015-08-24 MED ORDER — LIDOCAINE HCL (CARDIAC) 20 MG/ML IV SOLN
INTRAVENOUS | Status: DC | PRN
  Administered 2015-08-24: 60 mg via INTRAVENOUS

## 2015-08-24 MED ORDER — DEXAMETHASONE SODIUM PHOSPHATE 10 MG/ML IJ SOLN
INTRAMUSCULAR | Status: DC | PRN
  Administered 2015-08-24: 5 mg via INTRAVENOUS

## 2015-08-24 MED ORDER — HYDROCODONE-ACETAMINOPHEN 5-325 MG PO TABS
1.0000 | ORAL_TABLET | Freq: Four times a day (QID) | ORAL | Status: DC | PRN
  Administered 2015-08-25: 1 via ORAL
  Filled 2015-08-24: qty 1

## 2015-08-24 MED ORDER — FENTANYL CITRATE (PF) 100 MCG/2ML IJ SOLN
INTRAMUSCULAR | Status: AC
  Filled 2015-08-24: qty 2

## 2015-08-24 MED ORDER — DIPHENHYDRAMINE HCL 50 MG/ML IJ SOLN: 25.0000 mg | Freq: Four times a day (QID) | INTRAMUSCULAR | Status: DC | PRN

## 2015-08-24 MED ORDER — FAMOTIDINE 20 MG PO TABS
20.0000 mg | ORAL_TABLET | Freq: Once | ORAL | Status: AC
  Administered 2015-08-24: 20 mg via ORAL

## 2015-08-24 MED ORDER — FENTANYL CITRATE (PF) 100 MCG/2ML IJ SOLN
INTRAMUSCULAR | Status: DC | PRN
  Administered 2015-08-24 (×3): 50 ug via INTRAVENOUS
  Administered 2015-08-24 (×2): 25 ug via INTRAVENOUS

## 2015-08-24 MED ORDER — BUPIVACAINE HCL (PF) 0.5 % IJ SOLN
INTRAMUSCULAR | Status: AC
  Filled 2015-08-24: qty 30

## 2015-08-24 MED ORDER — FENTANYL CITRATE (PF) 100 MCG/2ML IJ SOLN
25.0000 ug | INTRAMUSCULAR | Status: DC | PRN
  Administered 2015-08-24 (×2): 25 ug via INTRAVENOUS

## 2015-08-24 MED ORDER — DIPHENHYDRAMINE HCL 25 MG PO CAPS: 25.0000 mg | ORAL_CAPSULE | Freq: Four times a day (QID) | ORAL | Status: DC | PRN

## 2015-08-24 MED ORDER — ACETAMINOPHEN 650 MG RE SUPP: 650.0000 mg | Freq: Four times a day (QID) | RECTAL | Status: DC | PRN

## 2015-08-24 MED ORDER — FAMOTIDINE 20 MG PO TABS
ORAL_TABLET | ORAL | Status: AC
  Filled 2015-08-24: qty 1

## 2015-08-24 MED ORDER — ACETAMINOPHEN 325 MG PO TABS: 650.0000 mg | ORAL_TABLET | Freq: Four times a day (QID) | ORAL | Status: DC | PRN

## 2015-08-24 MED ORDER — MIDAZOLAM HCL 5 MG/5ML IJ SOLN
INTRAMUSCULAR | Status: DC | PRN
  Administered 2015-08-24: 2 mg via INTRAVENOUS

## 2015-08-24 MED ORDER — ZOLPIDEM TARTRATE 5 MG PO TABS: 5.0000 mg | ORAL_TABLET | Freq: Every evening | ORAL | Status: DC | PRN

## 2015-08-24 SURGICAL SUPPLY — 24 items
BNDG GAUZE 4.5X4.1 6PLY STRL (MISCELLANEOUS) ×2 IMPLANT
CANISTER SUCT 1200ML W/VALVE (MISCELLANEOUS) ×2 IMPLANT
CHLORAPREP W/TINT 26ML (MISCELLANEOUS) ×2 IMPLANT
DRAPE LAPAROTOMY 100X77 ABD (DRAPES) ×2 IMPLANT
DRSG TEGADERM 4X4.75 (GAUZE/BANDAGES/DRESSINGS) ×2 IMPLANT
DRSG TELFA 3X8 NADH (GAUZE/BANDAGES/DRESSINGS) ×2 IMPLANT
ELECT REM PT RETURN 9FT ADLT (ELECTROSURGICAL) ×2
ELECTRODE REM PT RTRN 9FT ADLT (ELECTROSURGICAL) ×1 IMPLANT
GAUZE SPONGE 4X4 12PLY STRL (GAUZE/BANDAGES/DRESSINGS) ×2 IMPLANT
GLOVE BIO SURGEON STRL SZ7 (GLOVE) ×2 IMPLANT
GOWN STRL REUS W/ TWL LRG LVL3 (GOWN DISPOSABLE) ×2 IMPLANT
GOWN STRL REUS W/TWL LRG LVL3 (GOWN DISPOSABLE) ×2
KIT RM TURNOVER STRD PROC AR (KITS) ×2 IMPLANT
LABEL OR SOLS (LABEL) ×2 IMPLANT
NEEDLE HYPO 25X1 1.5 SAFETY (NEEDLE) ×2 IMPLANT
NS IRRIG 500ML POUR BTL (IV SOLUTION) ×2 IMPLANT
PACK BASIN MINOR ARMC (MISCELLANEOUS) ×2 IMPLANT
STRIP CLOSURE SKIN 1/2X4 (GAUZE/BANDAGES/DRESSINGS) ×2 IMPLANT
SUT VIC AB 3-0 SH 27 (SUTURE)
SUT VIC AB 3-0 SH 27X BRD (SUTURE) IMPLANT
SUT VIC AB 4-0 FS2 27 (SUTURE) IMPLANT
SWAB DUAL CULTURE TRANS RED ST (MISCELLANEOUS) ×2 IMPLANT
SWABSTK COMLB BENZOIN TINCTURE (MISCELLANEOUS) ×2 IMPLANT
SYR CONTROL 10ML (SYRINGE) ×2 IMPLANT

## 2015-08-24 NOTE — Progress Notes (Signed)
Patient ID: Yvonne Reid, female   DOB: 12/23/1985, 30 y.o.   MRN: [id number]  Chief Complaint  Patient presents with  . Other    spider bite    HPI Yvonne Reid is a 30 y.o. female here today for a evaluation of a spider bite on the right upper arm. She noticed this two days ago. She is currently take clindamycin given to her at the ER on 08/23/15.  Patient woke up on Tuesday morning to what she thought was a mosquito bite, throughout the day the area grew more red and painful.  She then went to the ED on Wednesday She is currently take clindamycin given to her at the ER on 08/23/15.  I have reviewed the history of present illness with the patient.   HPI  Past Medical History  Diagnosis Date  . Seizures (HCC)     PSEUDOSEIZURES  . Asthma     Past Surgical History  Procedure Laterality Date  . Cesarean section  2009    History reviewed. No pertinent family history.  Social History Social History  Substance Use Topics  . Smoking status: Current Every Day Smoker  . Smokeless tobacco: None  . Alcohol Use: No    No Known Allergies  Current Outpatient Prescriptions  Medication Sig Dispense Refill  . clindamycin (CLEOCIN) 300 MG capsule Take 1 capsule (300 mg total) by mouth 3 (three) times daily. 30 capsule 0  . HYDROcodone-acetaminophen (NORCO) 5-325 MG tablet Take 1 tablet by mouth every 6 (six) hours as needed for severe pain. 6 tablet 0   No current facility-administered medications for this visit.    Review of Systems Review of Systems  Constitutional: Negative.   Respiratory: Negative.   Cardiovascular: Negative.     Blood pressure 120/72, pulse 86, temperature 98.8 F (37.1 C), temperature source Oral, resp. rate 14, height 5' 1" (1.549 m), weight 204 lb (92.534 kg), last menstrual period 08/06/2015.  Physical Exam Physical Exam  Constitutional: She is oriented to person, place, and time. She appears well-developed and well-nourished.  Eyes:  Conjunctivae are normal. No scleral icterus.  Neck: Neck supple.  Cardiovascular: Normal rate, regular rhythm and normal heart sounds.   Pulmonary/Chest: Effort normal and breath sounds normal.  Lymphadenopathy:    She has no cervical adenopathy.    She has no axillary adenopathy.  Neurological: She is alert and oriented to person, place, and time.  Skin: Skin is warm and dry.     Central area is 8 cm of induration with surrounding erythema  on lateral aspect of upper extremity near shoulder.  Discolored 2 cm blistering skin in central portion, Surrounding erythema extends to the elbow.     Data Reviewed notes  Assessment    Cellulitis secondary to an insect bite, possible abscess and tissue necrosis    Plan    Drainage and debridement of wound under general anesthesia is to be scheduled for today. Risks and benefits of the procedure have been discussed with the patient and patient is agreeable to the plan.   Patient to have surgery later today.     PCP:  Wicker, Cheryl Ref Larry Sykes PA This information has been scribed by Marsha Hatch RN, BSN,BC.   Mertice Uffelman G 08/24/2015, 12:15 PM    

## 2015-08-24 NOTE — Anesthesia Postprocedure Evaluation (Signed)
Anesthesia Post Note  Patient: Yvonne Reid  Procedure(s) Performed: Procedure(s) (LRB): IRRIGATION AND DEBRIDEMENT EXTREMITY (Right)  Patient location during evaluation: PACU Anesthesia Type: General Level of consciousness: awake and alert Pain management: pain level controlled Vital Signs Assessment: post-procedure vital signs reviewed and stable Respiratory status: spontaneous breathing, nonlabored ventilation, respiratory function stable and patient connected to nasal cannula oxygen Cardiovascular status: blood pressure returned to baseline and stable Postop Assessment: no signs of nausea or vomiting Anesthetic complications: no    Last Vitals:  Filed Vitals:   08/24/15 1741 08/24/15 1802  BP: 103/68 108/63  Pulse: 86 86  Temp: 36.4 C 37.1 C  Resp: 19     Last Pain:  Filed Vitals:   08/24/15 1803  PainSc: 3                  Cleda MccreedyJoseph K Shernita Rabinovich

## 2015-08-24 NOTE — Transfer of Care (Signed)
Immediate Anesthesia Transfer of Care Note  Patient: Yvonne Reid  Procedure(s) Performed: Procedure(s): IRRIGATION AND DEBRIDEMENT EXTREMITY (Right)  Patient Location: PACU  Anesthesia Type:General  Level of Consciousness: sedated  Airway & Oxygen Therapy: Patient Spontanous Breathing and Patient connected to face mask oxygen  Post-op Assessment: Report given to RN  Post vital signs: Reviewed and stable  Last Vitals:  Filed Vitals:   08/24/15 1501 08/24/15 1656  BP: 134/70 95/59  Pulse: 111 85  Temp: 37.2 C 36.7 C  Resp: 16 16    Last Pain: There were no vitals filed for this visit.       Complications: No apparent anesthesia complications

## 2015-08-24 NOTE — Op Note (Signed)
Preop diagnosis: cellulitis of right arm likely secondary to insect bite  Post op diagnosis: same  Operation: Excision and debridement of infected skin   Surgeon: Timoteo ExposeS.G. Yvonne Reid   Assistant:    Anesthesia: General  Complications: None  EBL: minimal  Drains: None  Description:Patient was put to sleep with LMA, time out was performed. Right arm outer aspect was prepped and draped. The area of infection was about 7-8 cm in diameter with a 2 cm circular area in the center that was dusky in appearance. This central portion of the skin was excised out including part of the underlying subcutaneous tissue. There did not appear to be any visible pus but there was two tiny pockets of clear fluid and there was moderate amount of inflammation of the subcutaneous tissue. The area was covered with 4x4 and Kerlex. Patient was subsequently extubated and moved to the recovery room in stable condition.

## 2015-08-24 NOTE — Anesthesia Preprocedure Evaluation (Signed)
Anesthesia Evaluation  Patient identified by MRN, date of birth, ID band Patient awake    Reviewed: Allergy & Precautions, H&P , NPO status , Patient's Chart, lab work & pertinent test results  History of Anesthesia Complications Negative for: history of anesthetic complications  Airway Mallampati: III  TM Distance: >3 FB Neck ROM: full    Dental  (+) Poor Dentition, Chipped, Missing   Pulmonary neg shortness of breath, asthma , Current Smoker,    Pulmonary exam normal breath sounds clear to auscultation       Cardiovascular Exercise Tolerance: Good (-) angina(-) Past MI and (-) DOE negative cardio ROS Normal cardiovascular exam Rhythm:regular Rate:Normal     Neuro/Psych  Headaches, Seizures -,  negative psych ROS   GI/Hepatic negative GI ROS, Neg liver ROS,   Endo/Other  negative endocrine ROS  Renal/GU negative Renal ROS  negative genitourinary   Musculoskeletal   Abdominal   Peds  Hematology negative hematology ROS (+)   Anesthesia Other Findings Past Medical History:   Seizures (HCC)                                                 Comment:PSEUDOSEIZURES   Asthma                                                       Headache                                                    Past Surgical History:   CESAREAN SECTION                                 2009        BMI    Body Mass Index   38.56 kg/m 2      Reproductive/Obstetrics negative OB ROS                             Anesthesia Physical Anesthesia Plan  ASA: III  Anesthesia Plan: General LMA   Post-op Pain Management:    Induction:   Airway Management Planned:   Additional Equipment:   Intra-op Plan:   Post-operative Plan:   Informed Consent: I have reviewed the patients History and Physical, chart, labs and discussed the procedure including the risks, benefits and alternatives for the proposed anesthesia with  the patient or authorized representative who has indicated his/her understanding and acceptance.   Dental Advisory Given  Plan Discussed with: Anesthesiologist, CRNA and Surgeon  Anesthesia Plan Comments:         Anesthesia Quick Evaluation

## 2015-08-24 NOTE — Anesthesia Procedure Notes (Signed)
Procedure Name: LMA Insertion Date/Time: 08/24/2015 4:15 PM Performed by: Lily KocherPERALTA, Lylian Sanagustin Pre-anesthesia Checklist: Patient identified, Patient being monitored, Timeout performed, Emergency Drugs available and Suction available Patient Re-evaluated:Patient Re-evaluated prior to inductionOxygen Delivery Method: Circle system utilized Preoxygenation: Pre-oxygenation with 100% oxygen Intubation Type: IV induction Ventilation: Mask ventilation without difficulty LMA: LMA inserted LMA Size: 4.0 Tube type: Oral Number of attempts: 1 Placement Confirmation: positive ETCO2 and breath sounds checked- equal and bilateral Tube secured with: Tape Dental Injury: Teeth and Oropharynx as per pre-operative assessment

## 2015-08-24 NOTE — Patient Instructions (Signed)
Follow up as needed

## 2015-08-25 ENCOUNTER — Encounter: Payer: Self-pay | Admitting: General Surgery

## 2015-08-25 DIAGNOSIS — L03113 Cellulitis of right upper limb: Secondary | ICD-10-CM | POA: Diagnosis present

## 2015-08-25 LAB — CBC
HCT: 39.2 % (ref 35.0–47.0)
Hemoglobin: 13 g/dL (ref 12.0–16.0)
MCH: 28.5 pg (ref 26.0–34.0)
MCHC: 33.2 g/dL (ref 32.0–36.0)
MCV: 85.8 fL (ref 80.0–100.0)
PLATELETS: 269 10*3/uL (ref 150–440)
RBC: 4.58 MIL/uL (ref 3.80–5.20)
RDW: 13.3 % (ref 11.5–14.5)
WBC: 11.7 10*3/uL — AB (ref 3.6–11.0)

## 2015-08-25 MED ORDER — CEPHALEXIN 500 MG PO CAPS: 500.0000 mg | ORAL_CAPSULE | Freq: Two times a day (BID) | ORAL | Status: DC

## 2015-08-25 NOTE — H&P (View-Only) (Signed)
Patient ID: Yvonne Reid, female   DOB: 12/31/1985, 30 y.o.   MRN: [id number]  Chief Complaint  Patient presents with  . Other    spider bite    HPI Yvonne Reid is a 30 y.o. female here today for a evaluation of a spider bite on the right upper arm. She noticed this two days ago. She is currently take clindamycin given to her at the ER on 08/23/15.  Patient woke up on Tuesday morning to what she thought was a mosquito bite, throughout the day the area grew more red and painful.  She then went to the ED on Wednesday She is currently take clindamycin given to her at the ER on 08/23/15.  I have reviewed the history of present illness with the patient.   HPI  Past Medical History  Diagnosis Date  . Seizures (HCC)     PSEUDOSEIZURES  . Asthma     Past Surgical History  Procedure Laterality Date  . Cesarean section  2009    History reviewed. No pertinent family history.  Social History Social History  Substance Use Topics  . Smoking status: Current Every Day Smoker  . Smokeless tobacco: None  . Alcohol Use: No    No Known Allergies  Current Outpatient Prescriptions  Medication Sig Dispense Refill  . clindamycin (CLEOCIN) 300 MG capsule Take 1 capsule (300 mg total) by mouth 3 (three) times daily. 30 capsule 0  . HYDROcodone-acetaminophen (NORCO) 5-325 MG tablet Take 1 tablet by mouth every 6 (six) hours as needed for severe pain. 6 tablet 0   No current facility-administered medications for this visit.    Review of Systems Review of Systems  Constitutional: Negative.   Respiratory: Negative.   Cardiovascular: Negative.     Blood pressure 120/72, pulse 86, temperature 98.8 F (37.1 C), temperature source Oral, resp. rate 14, height 5\' 1"  (1.549 m), weight 204 lb (92.534 kg), last menstrual period 08/06/2015.  Physical Exam Physical Exam  Constitutional: She is oriented to person, place, and time. She appears well-developed and well-nourished.  Eyes:  Conjunctivae are normal. No scleral icterus.  Neck: Neck supple.  Cardiovascular: Normal rate, regular rhythm and normal heart sounds.   Pulmonary/Chest: Effort normal and breath sounds normal.  Lymphadenopathy:    She has no cervical adenopathy.    She has no axillary adenopathy.  Neurological: She is alert and oriented to person, place, and time.  Skin: Skin is warm and dry.     Central area is 8 cm of induration with surrounding erythema  on lateral aspect of upper extremity near shoulder.  Discolored 2 cm blistering skin in central portion, Surrounding erythema extends to the elbow.     Data Reviewed notes  Assessment    Cellulitis secondary to an insect bite, possible abscess and tissue necrosis    Plan    Drainage and debridement of wound under general anesthesia is to be scheduled for today. Risks and benefits of the procedure have been discussed with the patient and patient is agreeable to the plan.   Patient to have surgery later today.     PCP:  Gabriel CirriWicker, Cheryl Ref Zara ChessLarry Sykes PA This information has been scribed by Dorathy DaftMarsha Hatch RN, BSN,BC.   SANKAR,SEEPLAPUTHUR G 08/24/2015, 12:15 PM

## 2015-08-25 NOTE — Discharge Summary (Addendum)
  Patient had an insect bite in right upper arm 3 days ago. Developed severe redness, blistering and erythema down the arm. She underwent debridement of the insect bite site in OR yesterday. She feels much better this am. Redness and induration receding. Open wound is clean. Blistering of surrounding skin is resolving. WBC down to 11 K from 13. No fever, VSS. Discharge home on Keflex.  Diagnosis: insect bite right arm with cellulitis  Procedure. Debridement and drainage.

## 2015-08-25 NOTE — Progress Notes (Signed)
08/25/2015   BP 103/57 mmHg  Pulse 78  Temp(Src) 98.3 F (36.8 C) (Oral)  Resp 20  Ht 5\' 1"  (1.549 m)  Wt 92.534 kg (204 lb)  BMI 38.57 kg/m2  SpO2 98%  LMP 08/06/2015 (Approximate) Patient discharged per MD orders. Discharge instructions reviewed with patient and patient verbalized understanding. IV removed per policy. Prescriptions discussed and given to patient. Discharged ambulatory escorted by significant other.  Ron ParkerHerron, Vilma Will D, RN

## 2015-08-25 NOTE — Interval H&P Note (Signed)
History and Physical Interval Note:  08/25/2015 10:16 AM  York RamStephanie L Cockerell  has presented today for surgery, with the diagnosis of insect bite right arm  The various methods of treatment have been discussed with the patient and family. After consideration of risks, benefits and other options for treatment, the patient has consented to  Procedure(s): IRRIGATION AND DEBRIDEMENT EXTREMITY (Right) as a surgical intervention .  The patient's history has been reviewed, patient examined, no change in status, stable for surgery.  I have reviewed the patient's chart and labs.  Questions were answered to the patient's satisfaction.     Tank Difiore G

## 2015-08-28 LAB — SURGICAL PATHOLOGY

## 2015-08-30 ENCOUNTER — Ambulatory Visit (INDEPENDENT_AMBULATORY_CARE_PROVIDER_SITE_OTHER): Payer: 59 | Admitting: General Surgery

## 2015-08-30 ENCOUNTER — Encounter: Payer: Self-pay | Admitting: General Surgery

## 2015-08-30 VITALS — BP 120/78 | HR 82 | Resp 14 | Ht 61.0 in | Wt 201.0 lb

## 2015-08-30 DIAGNOSIS — L03113 Cellulitis of right upper limb: Secondary | ICD-10-CM

## 2015-08-30 NOTE — Patient Instructions (Signed)
The patient is aware to call back for any questions or concerns.  

## 2015-08-30 NOTE — Progress Notes (Signed)
Patient ID: Yvonne Reid, female   DOB: 08/08/1985, [age] y.o.   MRN: [id number]  Here today for postoperative visit, I/D right arm on 08-24-15. She states she is doing well. Still taking pain medication and antibiotics. She is cleaning the area with peroxide and water then applying gauze daily. I have reviewed the history of present illness with the patient.  Wound is healing appropriately. Surrounding area is non-erythematous and blisters have resolved. Skin is dry and scabbed where prior blistering reaction had occurred.   Patient is cleared for exertional activities as tolerated Follow up in 3 weeks  PCP:  Jamesetta OrleansWicker, This information has been scribed by Dorathy DaftMarsha Hatch RN, BSN,BC.

## 2015-09-20 ENCOUNTER — Encounter: Payer: Self-pay | Admitting: General Surgery

## 2015-09-20 ENCOUNTER — Ambulatory Visit (INDEPENDENT_AMBULATORY_CARE_PROVIDER_SITE_OTHER): Payer: 59 | Admitting: General Surgery

## 2015-09-20 VITALS — BP 148/82 | HR 90 | Resp 16 | Ht 61.0 in | Wt 206.0 lb

## 2015-09-20 DIAGNOSIS — L03113 Cellulitis of right upper limb: Secondary | ICD-10-CM

## 2015-09-20 NOTE — Patient Instructions (Signed)
Patient to return as needed. 

## 2015-09-20 NOTE — Progress Notes (Signed)
Patient ID: Yvonne RamStephanie L Reid, female   DOB: 10/04/1985, 30 y.o.   MRN: [id number]    Here today for postoperative visit, I/D right arm on 08-24-15. She states she is doing well. She is cleaning the area with peroxide and water then applying neosporin and gauze daily. She states it is still draining some.   I have reviewed the history of present illness with the patient.  Wound is healing appropriately. Surrounding area is non-erythematous and blisters have resolved. Skin is dry and scabbed where prior blistering reaction had occurred.   Patient to return as needed.     PCP:  Jamesetta OrleansWicker This information has been scribed by Dorathy DaftMarsha Hatch RN, BSN,BC.

## 2016-03-08 ENCOUNTER — Emergency Department
Admission: EM | Admit: 2016-03-08 | Discharge: 2016-03-08 | Disposition: A | Discharge status: Home / Self Care | Payer: 59 | Attending: Emergency Medicine | Admitting: Emergency Medicine

## 2016-03-08 ENCOUNTER — Encounter: Payer: Self-pay | Admitting: Emergency Medicine

## 2016-03-08 DIAGNOSIS — L03114 Cellulitis of left upper limb: Secondary | ICD-10-CM | POA: Insufficient documentation

## 2016-03-08 DIAGNOSIS — M7989 Other specified soft tissue disorders: Secondary | ICD-10-CM | POA: Diagnosis present

## 2016-03-08 DIAGNOSIS — L03119 Cellulitis of unspecified part of limb: Secondary | ICD-10-CM

## 2016-03-08 DIAGNOSIS — L03113 Cellulitis of right upper limb: Secondary | ICD-10-CM

## 2016-03-08 DIAGNOSIS — J45909 Unspecified asthma, uncomplicated: Secondary | ICD-10-CM | POA: Diagnosis not present

## 2016-03-08 DIAGNOSIS — F17210 Nicotine dependence, cigarettes, uncomplicated: Secondary | ICD-10-CM | POA: Diagnosis not present

## 2016-03-08 DIAGNOSIS — L03312 Cellulitis of back [any part except buttock]: Secondary | ICD-10-CM | POA: Diagnosis not present

## 2016-03-08 LAB — GLUCOSE, CAPILLARY: GLUCOSE-CAPILLARY: 92 mg/dL (ref 65–99)

## 2016-03-08 MED ORDER — SULFAMETHOXAZOLE-TRIMETHOPRIM 800-160 MG PO TABS: 1.0000 | ORAL_TABLET | Freq: Two times a day (BID) | ORAL | 0 refills | Status: DC

## 2016-03-08 NOTE — ED Provider Notes (Signed)
[facility] Emergency Department Provider Note  ____________________________________________   First MD Initiated Contact with Patient 03/08/16 [phone]     (approximate)  I have reviewed the triage vital signs and the nursing notes.   HISTORY  Chief Complaint Rash   HPI Yvonne Reid is a 30 y.o. female is here with complaint of possible insect bites yesterday. Patient states that she did not actually see anything bite her that she had an area that became itchy and now has swollen and become red. She states that she has several areas that are very tender and feels warm. This includes her right elbow, left shoulder and left side of her back. She is unaware of any fever or chills. She has been using Benadryl and cortisone cream to the area without relief. She denies any previous known MRSA infections. Patient states that grandparents are diabetic however she has not been checked in many years for diabetes. Patient denies any food this morning.   Past Medical History:  Diagnosis Date  . Asthma   . Headache   . Seizures (HCC)    PSEUDOSEIZURES    Patient Active Problem List   Diagnosis Date Noted  . Cellulitis of right arm 08/25/2015  . Cellulitis of arm, right 08/24/2015    Past Surgical History:  Procedure Laterality Date  . CESAREAN SECTION  2009  . I&D EXTREMITY Right 08/24/2015   Procedure: IRRIGATION AND DEBRIDEMENT EXTREMITY;  Surgeon: Kieth BrightlySeeplaputhur G Sankar, MD;  Location: [facility] ORS;  Service: General;  Laterality: Right;    Prior to Admission medications   Medication Sig Start Date End Date Taking? Authorizing Provider  diphenhydrAMINE (SOMINEX) 25 MG tablet Take 25 mg by mouth at bedtime as needed for sleep.   Yes Historical Provider, MD  sulfamethoxazole-trimethoprim (BACTRIM DS,SEPTRA DS) 800-160 MG tablet Take 1 tablet by mouth 2 (two) times daily. 03/08/16   Tommi Rumpshonda L Derrick Orris, PA-C    Allergies Patient has no known allergies.  Family  History  Problem Relation Age of Onset  . Heart block Father   . Cancer Sister     Social History Social History  Substance Use Topics  . Smoking status: Current Every Day Smoker    Packs/day: 0.50    Years: 14.00    Types: Cigarettes  . Smokeless tobacco: Never Used  . Alcohol use 0.0 oz/week    Review of Systems Constitutional: No fever/chills Cardiovascular: Denies chest pain. Respiratory: Denies shortness of breath. Gastrointestinal:  No nausea, no vomiting.   Musculoskeletal: Negative for back pain. Skin: Positive rash/insect bite. Neurological: Negative for headaches, focal weakness or numbness.  10-point ROS otherwise negative.  ____________________________________________   PHYSICAL EXAM:  VITAL SIGNS: ED Triage Vitals  Enc Vitals Group     BP 03/08/16 0929 114/73     Pulse Rate 03/08/16 0929 89     Resp 03/08/16 0929 20     Temp 03/08/16 0929 98.9 F (37.2 C)     Temp Source 03/08/16 0929 Oral     SpO2 03/08/16 0929 98 %     Weight 03/08/16 0913 200 lb (90.7 kg)     Height 03/08/16 0913 5\' 1"  (1.549 m)     Head Circumference --      Peak Flow --      Pain Score --      Pain Loc --      Pain Edu? --      Excl. in GC? --     Constitutional: Alert and  oriented. Well appearing and in no acute distress. Eyes: Conjunctivae are normal. PERRL. EOMI. Head: Atraumatic. Nose: No congestion/rhinnorhea. Neck: No stridor.   Cardiovascular: Normal rate, regular rhythm. Grossly normal heart sounds.  Good peripheral circulation. Respiratory: Normal respiratory effort.  No retractions. Lungs CTAB. Musculoskeletal: Moves upper and lower extremities without difficulty. Normal gait was noted. Neurologic:  Normal speech and language. No gross focal neurologic deficits are appreciated.  Skin:  Skin is warm, dry. Red erythematous area posterior right elbow with 3 papular areas without active draining. Area is warm to touch. 2 erythematous macular areas left posterior  shoulder and back without active draining. Areas are warm to touch. Psychiatric: Mood and affect are normal. Speech and behavior are normal.  ____________________________________________   LABS (all labs ordered are listed, but only abnormal results are displayed)  Labs Reviewed  GLUCOSE, CAPILLARY  CBG MONITORING, ED    PROCEDURES  Procedure(s) performed: None  Procedures  Critical Care performed: No  ____________________________________________   INITIAL IMPRESSION / ASSESSMENT AND PLAN / ED COURSE  Pertinent labs & imaging results that were available during my care of the patient were reviewed by me and considered in my medical decision making (see chart for details).    Clinical Course   Patient's blood sugar was 92 fasting while in the emergency room. Patient was started on Septra DS twice a day for 10 days. She is to follow-up with her primary care doctor if any continued problems. Warm compresses to the area frequently especially her right elbow.   ____________________________________________   FINAL CLINICAL IMPRESSION(S) / ED DIAGNOSES  Final diagnoses:  Cellulitis of right elbow  Cellulitis of shoulder  Cellulitis of upper back excluding scapular region      NEW MEDICATIONS STARTED DURING THIS VISIT:  Discharge Medication List as of 03/08/2016 10:12 AM    START taking these medications   Details  sulfamethoxazole-trimethoprim (BACTRIM DS,SEPTRA DS) 800-160 MG tablet Take 1 tablet by mouth 2 (two) times daily., Starting Fri 03/08/2016, Print         Note:  This document was prepared using Dragon voice recognition software and may include unintentional dictation errors.    Tommi RumpsRhonda L Basem Yannuzzi, PA-C 03/08/16 1138    Jennye MoccasinBrian S Quigley, MD 03/08/16 1153

## 2016-03-08 NOTE — ED Triage Notes (Signed)
C/o bug bites to arms

## 2016-03-08 NOTE — ED Notes (Signed)
See triage note  States she noticed a possible insect bite to right arm yesterday  Area is red and swollen   Then woke up with another area to left shoulder

## 2016-03-08 NOTE — Discharge Instructions (Signed)
Warm moist compresses to right elbow frequently. Take antibiotics as directed Tylenol or ibuprofen as needed for pain [facility] if any continued problems

## 2017-04-25 ENCOUNTER — Encounter: Payer: Self-pay | Admitting: Emergency Medicine

## 2017-04-25 ENCOUNTER — Emergency Department: Payer: BLUE CROSS/BLUE SHIELD

## 2017-04-25 ENCOUNTER — Emergency Department
Admission: EM | Admit: 2017-04-25 | Discharge: 2017-04-25 | Disposition: A | Discharge status: Home / Self Care | Payer: BLUE CROSS/BLUE SHIELD | Attending: Emergency Medicine | Admitting: Emergency Medicine

## 2017-04-25 ENCOUNTER — Other Ambulatory Visit: Payer: Self-pay

## 2017-04-25 DIAGNOSIS — O200 Threatened abortion: Secondary | ICD-10-CM

## 2017-04-25 DIAGNOSIS — F17210 Nicotine dependence, cigarettes, uncomplicated: Secondary | ICD-10-CM | POA: Diagnosis not present

## 2017-04-25 DIAGNOSIS — O99330 Smoking (tobacco) complicating pregnancy, unspecified trimester: Secondary | ICD-10-CM | POA: Diagnosis not present

## 2017-04-25 DIAGNOSIS — O26899 Other specified pregnancy related conditions, unspecified trimester: Secondary | ICD-10-CM | POA: Diagnosis not present

## 2017-04-25 DIAGNOSIS — R1084 Generalized abdominal pain: Secondary | ICD-10-CM | POA: Diagnosis not present

## 2017-04-25 DIAGNOSIS — Z3A00 Weeks of gestation of pregnancy not specified: Secondary | ICD-10-CM | POA: Diagnosis not present

## 2017-04-25 DIAGNOSIS — O9989 Other specified diseases and conditions complicating pregnancy, childbirth and the puerperium: Secondary | ICD-10-CM | POA: Diagnosis not present

## 2017-04-25 DIAGNOSIS — O209 Hemorrhage in early pregnancy, unspecified: Secondary | ICD-10-CM | POA: Diagnosis not present

## 2017-04-25 LAB — URINALYSIS, COMPLETE (UACMP) WITH MICROSCOPIC
BACTERIA UA: NONE SEEN
Bilirubin Urine: NEGATIVE
GLUCOSE, UA: NEGATIVE mg/dL
KETONES UR: NEGATIVE mg/dL
Nitrite: NEGATIVE
PROTEIN: NEGATIVE mg/dL
Specific Gravity, Urine: 1.01 (ref 1.005–1.030)
pH: 7 (ref 5.0–8.0)

## 2017-04-25 LAB — COMPREHENSIVE METABOLIC PANEL
ALBUMIN: 4.2 g/dL (ref 3.5–5.0)
ALK PHOS: 70 U/L (ref 38–126)
ALT: 14 U/L (ref 14–54)
ANION GAP: 9 (ref 5–15)
AST: 22 U/L (ref 15–41)
BUN: 8 mg/dL (ref 6–20)
CALCIUM: 9.3 mg/dL (ref 8.9–10.3)
CHLORIDE: 106 mmol/L (ref 101–111)
CO2: 23 mmol/L (ref 22–32)
Creatinine, Ser: 0.75 mg/dL (ref 0.44–1.00)
GFR calc Af Amer: >60 mL/min (ref >60)
GFR calc non Af Amer: >60 mL/min (ref >60)
GLUCOSE: 92 mg/dL (ref 65–99)
Potassium: 4.2 mmol/L (ref 3.5–5.1)
Sodium: 138 mmol/L (ref 135–145)
Total Bilirubin: 0.5 mg/dL (ref 0.3–1.2)
Total Protein: 7.4 g/dL (ref 6.5–8.1)

## 2017-04-25 LAB — WET PREP, GENITAL
Sperm: NONE SEEN
Trich, Wet Prep: NONE SEEN
YEAST WET PREP: NONE SEEN

## 2017-04-25 LAB — CBC
HCT: 46 % (ref 35.0–47.0)
HEMOGLOBIN: 15.2 g/dL (ref 12.0–16.0)
MCH: 28.5 pg (ref 26.0–34.0)
MCHC: 33.2 g/dL (ref 32.0–36.0)
MCV: 85.9 fL (ref 80.0–100.0)
Platelets: 337 10*3/uL (ref 150–440)
RBC: 5.35 MIL/uL — ABNORMAL HIGH (ref 3.80–5.20)
RDW: 13.9 % (ref 11.5–14.5)
WBC: 9.6 10*3/uL (ref 3.6–11.0)

## 2017-04-25 LAB — CHLAMYDIA/NGC RT PCR (ARMC ONLY)
Chlamydia Tr: NOT DETECTED
N GONORRHOEAE: NOT DETECTED

## 2017-04-25 LAB — HCG, QUANTITATIVE, PREGNANCY: HCG, BETA CHAIN, QUANT, S: 112 m[IU]/mL — AB (ref <5)

## 2017-04-25 LAB — LIPASE, BLOOD: LIPASE: 34 U/L (ref 11–51)

## 2017-04-25 LAB — POCT PREGNANCY, URINE: Preg Test, Ur: POSITIVE — AB

## 2017-04-25 LAB — ABO/RH: ABO/RH(D): B POS

## 2017-04-25 NOTE — ED Notes (Signed)
Pt taken to room 18,  Notified Lona Kettleeresa RN of pt and care transferred.

## 2017-04-25 NOTE — Discharge Instructions (Signed)
please follow-up with Dr. Gaynelle ArabianShuman on Monday in her office. Please return here for worse pain fever vomiting or heavier bleeding.

## 2017-04-25 NOTE — ED Notes (Signed)
Pt states that she started having vaginal bleeding yesterday with lower abd cramping - she reports that she had a period 2 weeks ago but that she has also had a positive pregnancy test

## 2017-04-25 NOTE — ED Triage Notes (Signed)
Pt to ed with c/o generalized abd pain and vaginal bleeding that started yesterday.  Pt states took pregnancy test which was positive.  Pt also reports last menstrual period was 2 weeks ago.

## 2017-04-25 NOTE — ED Notes (Signed)
Spoke with lab about HCG not being ran on patient with other blood. Lab reported they will run test now

## 2017-04-25 NOTE — ED Provider Notes (Signed)
[facility] Emergency Department Provider Note   ____________________________________________   First MD Initiated Contact with Patient 04/25/17 1353     (approximate)  I have reviewed the triage vital signs and the nursing notes.   HISTORY  Chief Complaint Abdominal Pain and Vaginal Bleeding    HPI Yvonne Reid is a 32 y.o. female Who reports diffuse achy abdominal pain and vaginal bleeding starting yesterday.no fever she vomited once but that's it no further nausea vomiting no diarrhea   Past Medical History:  Diagnosis Date  . Asthma   . Headache   . Seizures (HCC)    PSEUDOSEIZURES    Patient Active Problem List   Diagnosis Date Noted  . Cellulitis of right arm 08/25/2015  . Cellulitis of arm, right 08/24/2015    Past Surgical History:  Procedure Laterality Date  . CESAREAN SECTION  2009  . I&D EXTREMITY Right 08/24/2015   Procedure: IRRIGATION AND DEBRIDEMENT EXTREMITY;  Surgeon: Kieth BrightlySeeplaputhur G Sankar, MD;  Location: [facility] ORS;  Service: General;  Laterality: Right;    Prior to Admission medications   Medication Sig Start Date End Date Taking? Authorizing Provider  diphenhydrAMINE (SOMINEX) 25 MG tablet Take 25 mg by mouth at bedtime as needed for sleep.    [provider]  sulfamethoxazole-trimethoprim (BACTRIM DS,SEPTRA DS) 800-160 MG tablet Take 1 tablet by mouth 2 (two) times daily. 03/08/16   Tommi RumpsSummers, Rhonda L, PA-C    Allergies Patient has no known allergies.  Family History  Problem Relation Age of Onset  . Heart block Father   . Cancer Sister     Social History Social History   Tobacco Use  . Smoking status: Current Every Day Smoker    Packs/day: 0.50    Years: 14.00    Pack years: 7.00    Types: Cigarettes  . Smokeless tobacco: Never Used  Substance Use Topics  . Alcohol use: Yes    Alcohol/week: 0.0 oz  . Drug use: No    Review of Systems  Constitutional: No fever/chills Eyes: No visual  changes. ENT: No sore throat. Cardiovascular: Denies chest pain. Respiratory: Denies shortness of breath. Gastrointestinal:C history of present illness Genitourinary: Negative for dysuria. Musculoskeletal: Negative for back pain. Skin: Negative for rash. Neurological: Negative for headaches, focal weakness   ____________________________________________   PHYSICAL EXAM:  VITAL SIGNS: ED Triage Vitals  Enc Vitals Group     BP 04/25/17 0903 (!) 127/91     Pulse Rate 04/25/17 0903 99     Resp 04/25/17 0903 18     Temp 04/25/17 0903 98.6 F (37 C)     Temp Source 04/25/17 0903 Oral     SpO2 04/25/17 0903 99 %     Weight 04/25/17 0921 200 lb (90.7 kg)     Height --      Head Circumference --      Peak Flow --      Pain Score 04/25/17 0920 5     Pain Loc --      Pain Edu? --      Excl. in GC? --     Constitutional: Alert and oriented. Well appearing and in no acute distress. Eyes: Conjunctivae are normal.  Head: Atraumatic. Nose: No congestion/rhinnorhea. Mouth/Throat: Mucous membranes are moist.  Oropharynx non-erythematous. Neck: No stridor.   Cardiovascular: Normal rate, regular rhythm. Grossly normal heart sounds.  Good peripheral circulation. Respiratory: Normal respiratory effort.  No retractions. Lungs CTAB. Gastrointestinal: Soft and nontender. No distention. No abdominal  bruits. No CVA tenderness. Genitourinary: normal perineum and vagina there is some scant bloody mucus in the vagina no cervical motion tenderness the os is closed. No masses that I can palpate in the adnexal area Musculoskeletal: No lower extremity tenderness nor edema.  No joint effusions. Neurologic:  Normal speech and language. No gross focal neurologic deficits are appreciated. No gait instability. Skin:  Skin is warm, dry and intact. No rash noted. Psychiatric: Mood and affect are normal. Speech and behavior are normal.  ____________________________________________   LABS (all labs ordered  are listed, but only abnormal results are displayed)  Labs Reviewed  WET PREP, GENITAL - Abnormal; Notable for the following components:      Result Value   Clue Cells Wet Prep HPF POC PRESENT (*)    WBC, Wet Prep HPF POC TOO NUMEROUS TO COUNT (*)    All other components within normal limits  CBC - Abnormal; Notable for the following components:   RBC 5.35 (*)    All other components within normal limits  URINALYSIS, COMPLETE (UACMP) WITH MICROSCOPIC - Abnormal; Notable for the following components:   Color, Urine STRAW (*)    Hgb urine dipstick MODERATE (*)    Leukocytes, UA TRACE (*)    Squamous Epithelial / LPF 6-30 (*)    All other components within normal limits  HCG, QUANTITATIVE, PREGNANCY - Abnormal; Notable for the following components:   hCG, Beta Chain, Quant, S 112 (*)    All other components within normal limits  POCT PREGNANCY, URINE - Abnormal; Notable for the following components:   Preg Test, Ur POSITIVE (*)    All other components within normal limits  CHLAMYDIA/NGC RT PCR ([facility] ONLY)  LIPASE, BLOOD  COMPREHENSIVE METABOLIC PANEL  POC URINE PREG, ED  ABO/RH   ____________________________________________  EKG   ____________________________________________  RADIOLOGY  ED MD interpretation:  ultrasound shows no intrauterine pregnancy Quant is very low  Official radiology report(s): US Ob Comp Less 14 Wks  Result Date: 04/25/2017 CLINICAL DATA:  Vaginal bleeding EXAM: OBSTETRIC <14 WK ULTRASOUND TECHNIQUE: Transabdominal ultrasound was performed for evaluation of the gestation as well as the maternal uterus and adnexal regions. COMPARISON:  None. FINDINGS: Intrauterine gestational sac: None Maternal uterus/adnexae: Normal. IMPRESSION: No IUP identified. In the setting of a positive pregnancy test, the lack of an IUP can be due to early pregnancy, ectopic pregnancy, or recent miscarriage. Recommend close clinical and imaging follow-up. Electronically  Signed   By: Gerome Sam III M.D   On: 04/25/2017 13:49   US Ob Transvaginal  Result Date: 04/25/2017 CLINICAL DATA:  Vaginal bleeding EXAM: OBSTETRIC <14 WK ULTRASOUND TECHNIQUE: Transabdominal ultrasound was performed for evaluation of the gestation as well as the maternal uterus and adnexal regions. COMPARISON:  None. FINDINGS: Intrauterine gestational sac: None Maternal uterus/adnexae: Normal. IMPRESSION: No IUP identified. In the setting of a positive pregnancy test, the lack of an IUP can be due to early pregnancy, ectopic pregnancy, or recent miscarriage. Recommend close clinical and imaging follow-up. Electronically Signed   By: Gerome Sam III M.D   On: 04/25/2017 13:49    ____________________________________________   PROCEDURES  Procedure(s) performed:   Procedures  Critical Care performed:   ____________________________________________   INITIAL IMPRESSION / ASSESSMENT AND PLAN / ED COURSE  discussed with Dr. Gaynelle Arabian OB/GYN and she will see her in the office on Monday if possible we'll try to get a quantitative hCG done Sunday.         ____________________________________________  FINAL CLINICAL IMPRESSION(S) / ED DIAGNOSES  Final diagnoses:  Threatened miscarriage     ED Discharge Orders    None       Note:  This document was prepared using Dragon voice recognition software and may include unintentional dictation errors.    Arnaldo Natal, MD 04/25/17 [id number]

## 2017-04-27 ENCOUNTER — Other Ambulatory Visit
Admission: RE | Admit: 2017-04-27 | Discharge: 2017-04-27 | Disposition: A | Discharge status: Home / Self Care | Payer: BLUE CROSS/BLUE SHIELD | Source: Ambulatory Visit | Attending: Emergency Medicine | Admitting: Emergency Medicine

## 2017-04-27 DIAGNOSIS — O0281 Inappropriate change in quantitative human chorionic gonadotropin (hCG) in early pregnancy: Secondary | ICD-10-CM | POA: Diagnosis not present

## 2017-04-27 LAB — HCG, QUANTITATIVE, PREGNANCY: hCG, Beta Chain, Quant, S: 25 m[IU]/mL — ABNORMAL HIGH (ref <5)

## 2017-04-28 ENCOUNTER — Ambulatory Visit (INDEPENDENT_AMBULATORY_CARE_PROVIDER_SITE_OTHER): Payer: BLUE CROSS/BLUE SHIELD | Admitting: Obstetrics & Gynecology

## 2017-04-28 ENCOUNTER — Encounter: Payer: Self-pay | Admitting: Obstetrics & Gynecology

## 2017-04-28 VITALS — BP 110/80 | HR 95 | Ht 61.0 in | Wt 198.0 lb

## 2017-04-28 DIAGNOSIS — O039 Complete or unspecified spontaneous abortion without complication: Secondary | ICD-10-CM | POA: Diagnosis not present

## 2017-04-28 NOTE — Progress Notes (Signed)
Obstetric Problem Visit   Chief Complaint:  Chief Complaint  Patient presents with  . Vaginal Bleeding   History of Present Illness: Patient is a 32 y.o. G2P0101 w reg cycles whose LMP was  5 weeks ago; pt had period followed 10 days later by bleeding for 2 days.  Seen in ER w Beta hCG levels 112 then 25 2 days later (yesterday).  No bleeding today.  No pain, nausea.  [place] normal on Friday, with no mass and no IUP.  Is bleeding equal to or greater than normal menstrual flow:  No Any recent trauma:  No Recent intercourse:  No History of prior miscarriage:  No Prior ultrasound demonstrating IUP:  No Prior ultrasound demonstrating viable IUP:  No Prior Serum HCG:  Yes Rh status: B+  PMHx: She  has a past medical history of Asthma, Headache, and Seizures (HCC). Also,  has a past surgical history that includes Cesarean section (2009) and I&D extremity (Right, 08/24/2015)., family history includes Cancer in her sister; Heart block in her father.,  reports that she has been smoking cigarettes.  She has a 7.00 pack-year smoking history. she has never used smokeless tobacco. She reports that she drinks alcohol. She reports that she does not use drugs.  She has a current medication list which includes the following prescription(s): diphenhydramine and sulfamethoxazole-trimethoprim. Also, is allergic to sulfa antibiotics.  Review of Systems  Constitutional: Negative for chills, fever and malaise/fatigue.  HENT: Negative for congestion, sinus pain and sore throat.   Eyes: Negative for blurred vision and pain.  Respiratory: Negative for cough and wheezing.   Cardiovascular: Negative for chest pain and leg swelling.  Gastrointestinal: Negative for abdominal pain, constipation, diarrhea, heartburn, nausea and vomiting.  Genitourinary: Negative for dysuria, frequency, hematuria and urgency.  Musculoskeletal: Negative for back pain, joint pain, myalgias and neck pain.  Skin: Negative for itching and  rash.  Neurological: Negative for dizziness, tremors and weakness.  Endo/Heme/Allergies: Does not bruise/bleed easily.  Psychiatric/Behavioral: Negative for depression. The patient is not nervous/anxious and does not have insomnia.     Objective: Vitals:   04/28/17 1322  BP: 110/80  Pulse: 95   Physical Exam  Constitutional: She is oriented to person, place, and time. She appears well-developed and well-nourished. No distress.  Musculoskeletal: Normal range of motion.  Neurological: She is alert and oriented to person, place, and time.  Skin: Skin is warm and dry.  Psychiatric: She has a normal mood and affect.  Vitals reviewed.  Assessment: 32 y.o. G2P0101: 1. Spon abortion w/o complication Plan: 1) First trimester bleeding - incidence and clinical course of first trimester bleeding is discussed in detail with the patient today.  Approximately 1/3 of pregnancies ending in live births experienced 1st trimester bleeding.  The amount of bleeding is variable and not necessarily predictive of outcome.  Sources may be cervical or uterine.  Subchorionic hemorrhages are a frequent concurrent findings on ultrasound and are followed expectantly.  These often absorb or regress spontaneously although risk for expansion and further disruption of the utero-placental interface leading to miscarriage is possible.  There is no clearly documented benefit to limiting or modifying activity and sexual intercourse in altering clinic course of 1st trimester bleeding.    2) If not already done will proceed with TVUS evaluation to document viability, and if uncertain viability or absence of a demonstrable IUP (and no previous documentation of IUP) will trend HCG levels.  3) The patient is Rh POS, rhogam is therefore not  indicated to decrease the risk rhesus alloimmunization.    4) Routine bleeding precautions were discussed with the patient prior the conclusion of today's visit.  5) Discussed Progesterone vag  therapy w next pregnancy, pros and cons and limited data, but may help her.  Also discussed Clomid, as it took her 5 years off of BC to get pregnant this time; desires.    6) Annual w PAP due; soon.  Annamarie MajorPaul Ethin Drummond, MD, Merlinda FrederickFACOG [facility], [facility] 04/28/2017  1:44 PM

## 2017-06-26 ENCOUNTER — Ambulatory Visit: Payer: BLUE CROSS/BLUE SHIELD | Admitting: Obstetrics & Gynecology

## 2017-07-29 ENCOUNTER — Ambulatory Visit: Payer: BLUE CROSS/BLUE SHIELD | Admitting: Obstetrics & Gynecology

## 2017-08-12 ENCOUNTER — Ambulatory Visit: Payer: BLUE CROSS/BLUE SHIELD | Admitting: Obstetrics & Gynecology

## 2018-05-07 DIAGNOSIS — G43909 Migraine, unspecified, not intractable, without status migrainosus: Secondary | ICD-10-CM | POA: Diagnosis not present

## 2018-07-18 IMAGING — US US OB TRANSVAGINAL
1 series · 14 of 28 positions shown · non-contrast
Comparison: None.

CLINICAL DATA: Vaginal bleeding

EXAM:
OBSTETRIC <14 WK ULTRASOUND
TECHNIQUE: Transabdominal ultrasound was performed for evaluation of the
gestation as well as the maternal uterus and adnexal regions.

[Series 1: us ob transvaginal · 0.23mm/px · 14 of 83 slices shown]
[im 4/83]
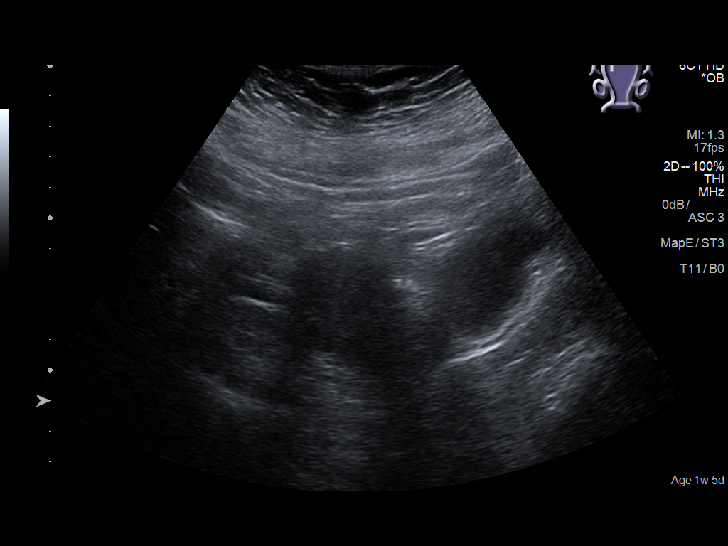
[im 10/83]
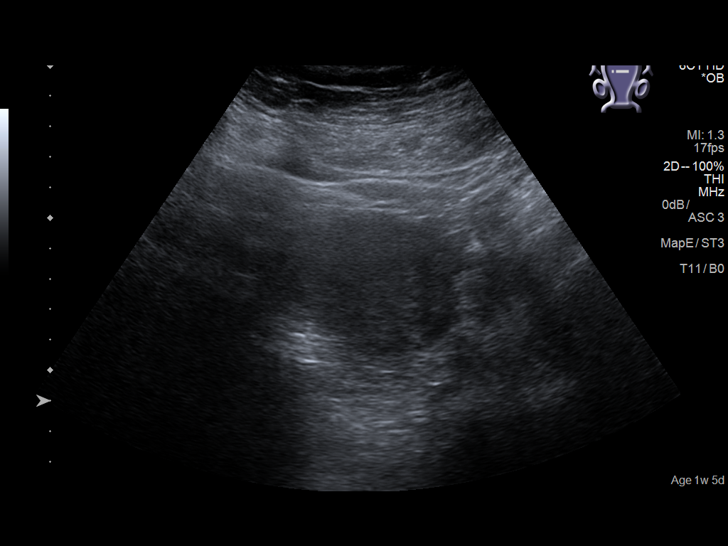
[im 16/83]
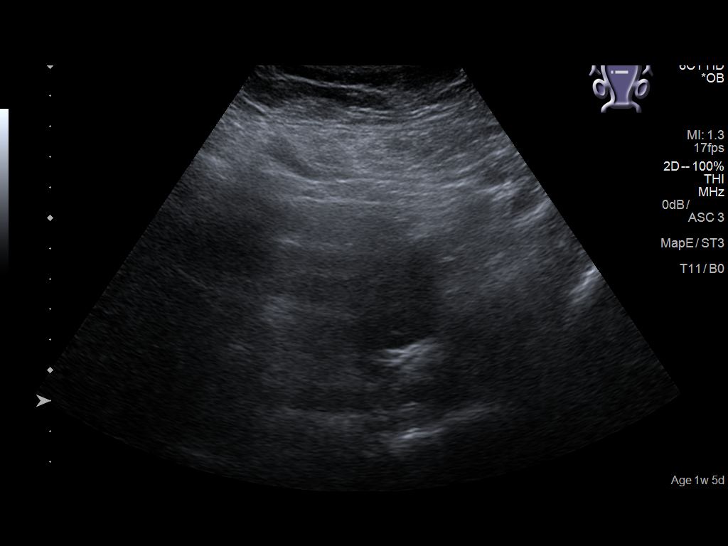
[im 22/83]
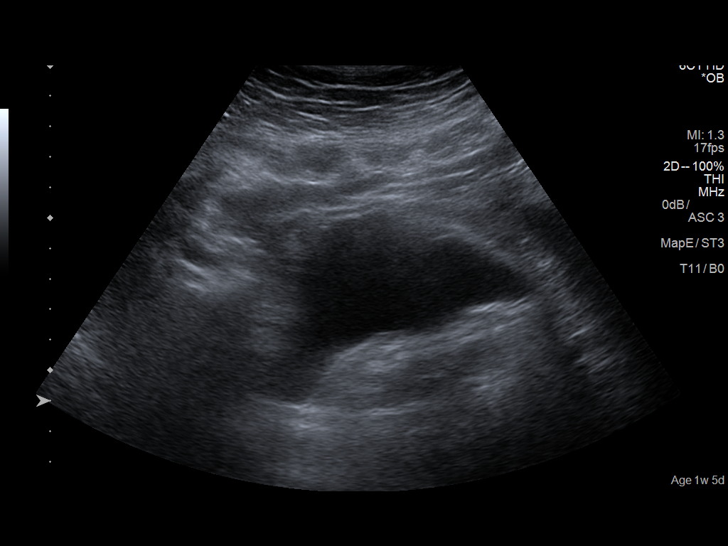
[im 28/83]
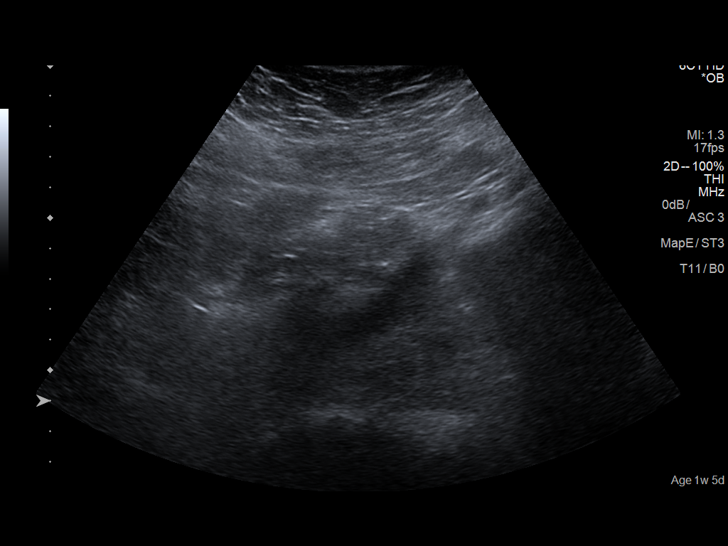
[im 34/83]
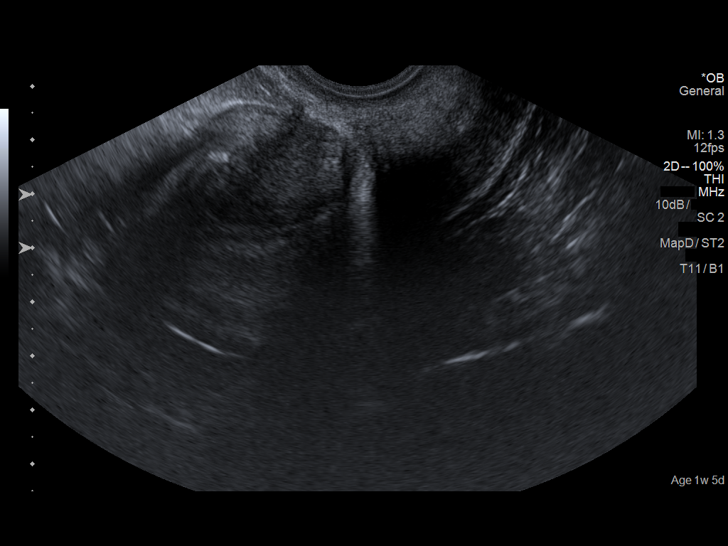
[im 40/83]
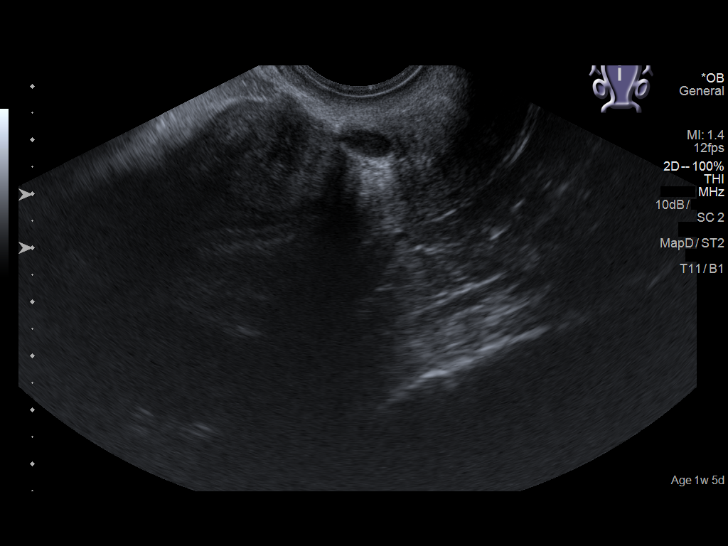
[im 46/83]
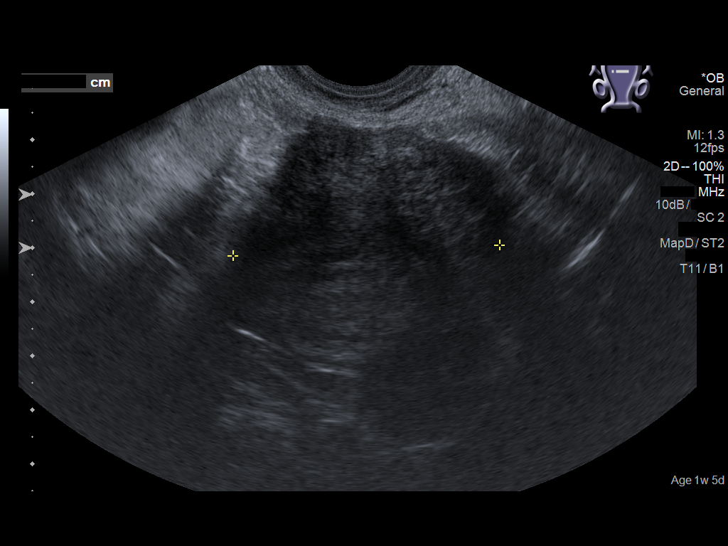
[im 52/83]
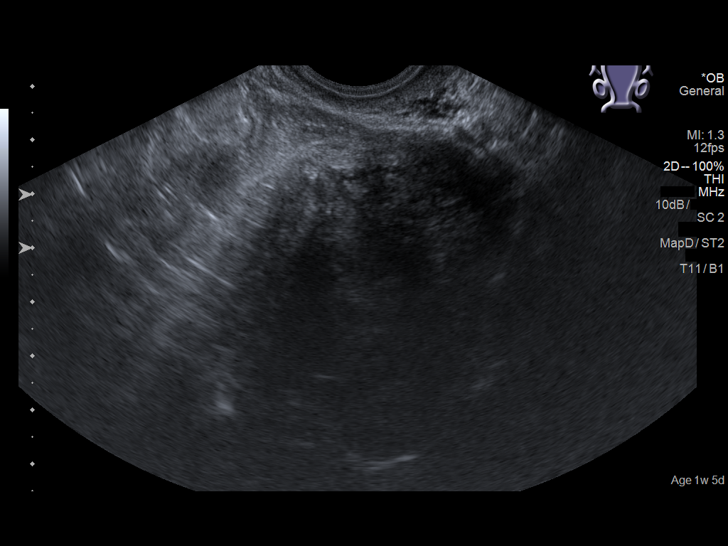
[im 58/83]
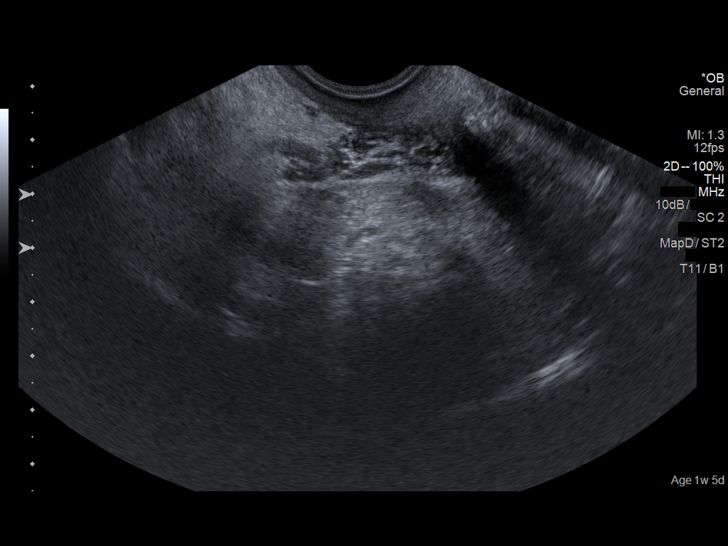
[im 64/83]
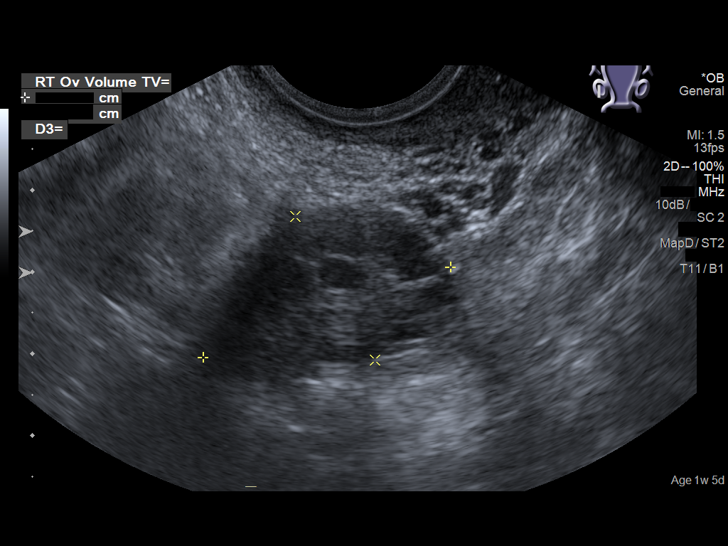
[im 70/83]
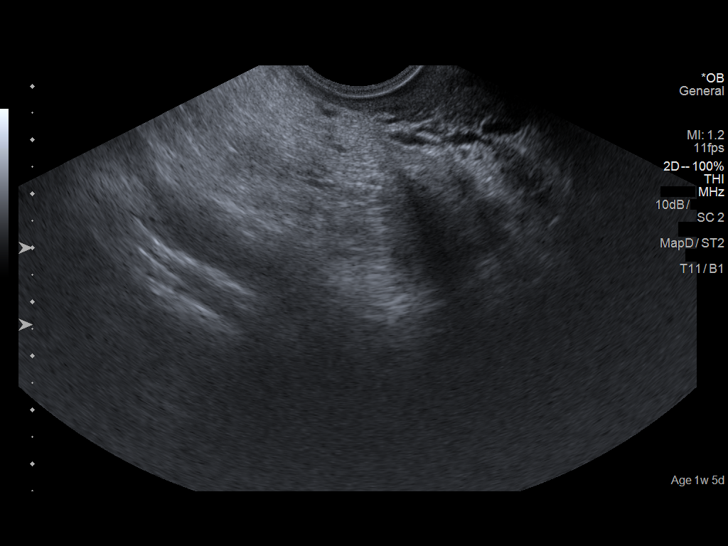
[im 76/83]
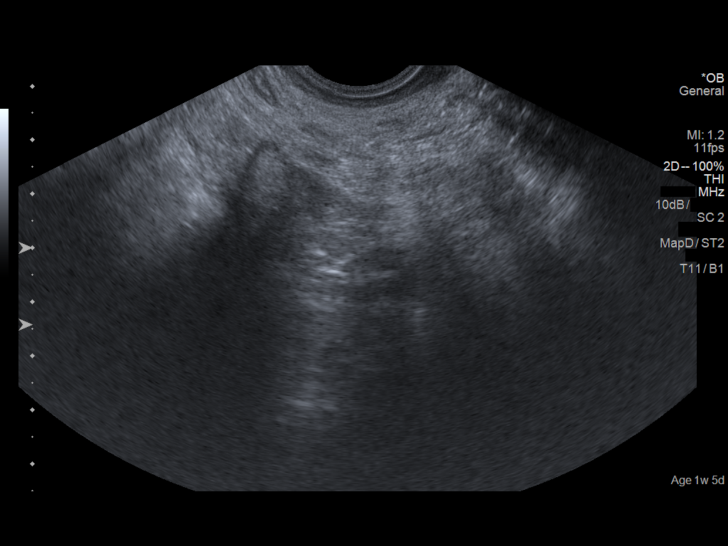
[im 83/83]
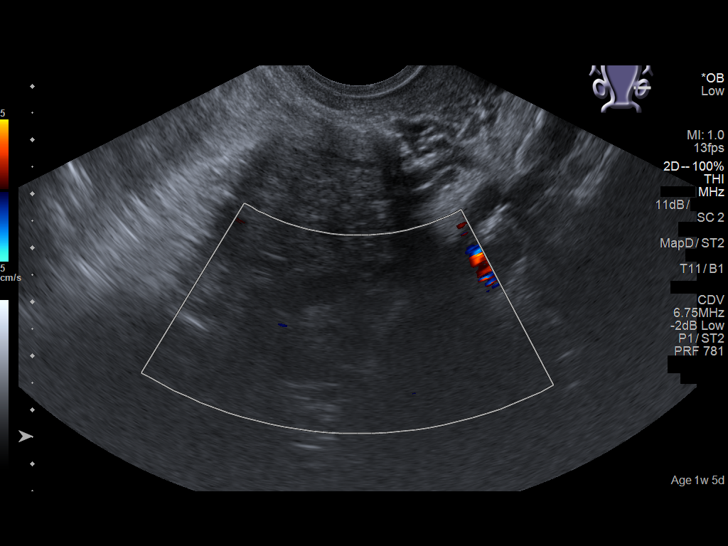

[14 of 28 positions shown; findings below may reference images not displayed]

FINDINGS: Intrauterine gestational sac: None

Maternal uterus/adnexae: Normal.
IMPRESSION: No IUP identified. In the setting of a positive pregnancy test, the
lack of an IUP can be due to early pregnancy, ectopic pregnancy, or
recent miscarriage. Recommend close clinical and imaging follow-up.

## 2018-10-06 DIAGNOSIS — Z03818 Encounter for observation for suspected exposure to other biological agents ruled out: Secondary | ICD-10-CM | POA: Diagnosis not present

## 2018-10-06 DIAGNOSIS — R05 Cough: Secondary | ICD-10-CM | POA: Diagnosis not present

## 2018-10-06 DIAGNOSIS — J40 Bronchitis, not specified as acute or chronic: Secondary | ICD-10-CM | POA: Diagnosis not present

## 2018-10-06 DIAGNOSIS — R197 Diarrhea, unspecified: Secondary | ICD-10-CM | POA: Diagnosis not present

## 2020-03-21 ENCOUNTER — Ambulatory Visit
Admission: RE | Admit: 2020-03-21 | Discharge: 2020-03-21 | Disposition: A | Discharge status: Home / Self Care | Payer: 59 | Source: Ambulatory Visit | Attending: Sports Medicine | Admitting: Sports Medicine

## 2020-03-21 ENCOUNTER — Other Ambulatory Visit: Payer: Self-pay

## 2020-03-21 VITALS — BP 107/62 | HR 102 | Temp 98.8°F | Resp 19 | Ht 60.0 in | Wt 200.0 lb

## 2020-03-21 DIAGNOSIS — U071 COVID-19: Secondary | ICD-10-CM | POA: Insufficient documentation

## 2020-03-21 DIAGNOSIS — M791 Myalgia, unspecified site: Secondary | ICD-10-CM

## 2020-03-21 DIAGNOSIS — R059 Cough, unspecified: Secondary | ICD-10-CM | POA: Diagnosis present

## 2020-03-21 DIAGNOSIS — R509 Fever, unspecified: Secondary | ICD-10-CM | POA: Diagnosis not present

## 2020-03-21 LAB — RAPID INFLUENZA A&B ANTIGENS
Influenza A (ARMC): NEGATIVE
Influenza B (ARMC): NEGATIVE

## 2020-03-21 MED ORDER — ALBUTEROL SULFATE HFA 108 (90 BASE) MCG/ACT IN AERS: 1.0000 | INHALATION_SPRAY | Freq: Four times a day (QID) | RESPIRATORY_TRACT | 0 refills | Status: AC | PRN

## 2020-03-21 MED ORDER — AEROCHAMBER PLUS MISC: 2 refills | Status: DC

## 2020-03-21 MED ORDER — AZITHROMYCIN 250 MG PO TABS: 250.0000 mg | ORAL_TABLET | Freq: Every day | ORAL | 0 refills | Status: DC

## 2020-03-21 NOTE — ED Provider Notes (Signed)
[facility]    CSN: [id number] Arrival date & time: 03/21/20  1231      History   Chief Complaint Chief Complaint  Patient presents with  . Cough    HPI Yvonne Reid is a 35 y.o. female.   Pleasant 35 year old female who presents with cough, fever, congestion, headache, body pain since yesterday.  She had a fever of 102 degrees last night.  She took Tylenol a few hours prior to arrival and is currently afebrile.  She has had COVID exposure in the workplace.  She has been vaccinated x2 but no influenza vaccine.  Denies any abdominal pain nausea vomiting or diarrhea.  No urinary symptoms.  Denies chest pain or significant shortness of breath.  No red flag signs or symptoms elicited on history.     Past Medical History:  Diagnosis Date  . Asthma   . Headache   . Seizures (HCC)    PSEUDOSEIZURES    Patient Active Problem List   Diagnosis Date Noted  . Spon abortion w/o complication 04/28/2017  . Cellulitis of right arm 08/25/2015  . Cellulitis of arm, right 08/24/2015    Past Surgical History:  Procedure Laterality Date  . CESAREAN SECTION  2009  . I & D EXTREMITY Right 08/24/2015   Procedure: IRRIGATION AND DEBRIDEMENT EXTREMITY;  Surgeon: Kieth Brightly, MD;  Location: [facility] ORS;  Service: General;  Laterality: Right;    OB History    Gravida  2   Para  1   Term      Preterm  1   AB      Living  1     SAB      IAB      Ectopic      Multiple      Live Births               Home Medications    Prior to Admission medications   Medication Sig Start Date End Date Taking? Authorizing Provider  albuterol (VENTOLIN HFA) 108 (90 Base) MCG/ACT inhaler Inhale 1-2 puffs into the lungs every 6 (six) hours as needed for wheezing or shortness of breath. 03/21/20  Yes Delton See, MD  azithromycin (ZITHROMAX) 250 MG tablet Take 1 tablet (250 mg total) by mouth daily. Take first 2 tablets together, then 1 every day until finished.  03/21/20  Yes Delton See, MD  Spacer/Aero-Holding Chambers (AEROCHAMBER PLUS) inhaler Use as instructed 03/21/20  Yes Delton See, MD  diphenhydrAMINE (SOMINEX) 25 MG tablet Take 25 mg by mouth at bedtime as needed for sleep.    [provider]  sulfamethoxazole-trimethoprim (BACTRIM DS,SEPTRA DS) 800-160 MG tablet Take 1 tablet by mouth 2 (two) times daily. Patient not taking: No sig reported 03/08/16   Tommi Rumps, PA-C    Family History Family History  Problem Relation Age of Onset  . Heart block Father   . Cancer Sister     Social History Social History   Tobacco Use  . Smoking status: Current Every Day Smoker    Packs/day: 0.50    Years: 14.00    Pack years: 7.00    Types: Cigarettes  . Smokeless tobacco: Never Used  Vaping Use  . Vaping Use: Never used  Substance Use Topics  . Alcohol use: Yes    Alcohol/week: 0.0 standard drinks    Comment: social  . Drug use: No     Allergies   Sulfa antibiotics   Review of Systems Review of  Systems  Constitutional: Positive for chills, fatigue and fever. Negative for activity change, appetite change and diaphoresis.  HENT: Positive for congestion, ear pain, sinus pressure and sore throat.   Eyes: Negative for pain.  Respiratory: Positive for cough and wheezing. Negative for chest tightness and shortness of breath.   Cardiovascular: Negative for chest pain and palpitations.  Gastrointestinal: Negative for abdominal pain.  Genitourinary: Negative for dysuria.  Musculoskeletal: Positive for myalgias.  Skin: Negative for color change, pallor, rash and wound.  Neurological: Positive for headaches. Negative for dizziness, weakness, light-headedness and numbness.  All other systems reviewed and are negative.    Physical Exam Triage Vital Signs ED Triage Vitals  Enc Vitals Group     BP 03/21/20 1313 107/62     Pulse Rate 03/21/20 1313 (!) 102     Resp 03/21/20 1313 19     Temp 03/21/20 1313 98.8 F  (37.1 C)     Temp Source 03/21/20 1313 Oral     SpO2 03/21/20 1313 99 %     Weight 03/21/20 1315 200 lb (90.7 kg)     Height 03/21/20 1315 5' (1.524 m)     Head Circumference --      Peak Flow --      Pain Score 03/21/20 1312 2     Pain Loc --      Pain Edu? --      Excl. in GC? --    No data found.  Updated Vital Signs BP 107/62 (BP Location: Left Arm)   Pulse (!) 102   Temp 98.8 F (37.1 C) (Oral)   Resp 19   Ht 5' (1.524 m)   Wt 90.7 kg   LMP 03/09/2020   SpO2 99%   BMI 39.06 kg/m   Visual Acuity Right Eye Distance:   Left Eye Distance:   Bilateral Distance:    Right Eye Near:   Left Eye Near:    Bilateral Near:     Physical Exam Vitals and nursing note reviewed.  Constitutional:      General: She is not in acute distress.    Appearance: Normal appearance. She is ill-appearing. She is not toxic-appearing or diaphoretic.  HENT:     Head: Normocephalic and atraumatic.     Right Ear: Tympanic membrane normal.     Left Ear: Tympanic membrane normal.     Nose: Congestion present. No rhinorrhea.     Mouth/Throat:     Mouth: Mucous membranes are moist.     Pharynx: No oropharyngeal exudate or posterior oropharyngeal erythema.  Eyes:     Extraocular Movements: Extraocular movements intact.     Conjunctiva/sclera: Conjunctivae normal.     Pupils: Pupils are equal, round, and reactive to light.  Neck:     Vascular: No carotid bruit.  Cardiovascular:     Rate and Rhythm: Normal rate and regular rhythm.     Pulses: Normal pulses.     Heart sounds: Normal heart sounds. No murmur heard. No friction rub. No gallop.   Pulmonary:     Effort: Pulmonary effort is normal.     Breath sounds: No stridor. Wheezing present. No rhonchi or rales.     Comments: Patient has wheeze in the left lung both in the upper lobe and at the base.  It does not clear with coughing.  I do not appreciate a wheeze on the right side. Musculoskeletal:     Cervical back: Normal range of motion  and neck supple. No rigidity.  Lymphadenopathy:     Cervical: Cervical adenopathy present.  Skin:    General: Skin is warm and dry.     Capillary Refill: Capillary refill takes less than 2 seconds.  Neurological:     Mental Status: She is alert.  Psychiatric:        Mood and Affect: Mood normal.      UC Treatments / Results  Labs (all labs ordered are listed, but only abnormal results are displayed) Labs Reviewed  RAPID INFLUENZA A&B ANTIGENS  SARS CORONAVIRUS 2 (TAT 6-24 HRS)    EKG   Radiology No results found.  Procedures Procedures (including critical care time)  Medications Ordered in UC Medications - No data to display  Initial Impression / Assessment and Plan / UC Course  I have reviewed the triage vital signs and the nursing notes.  Pertinent labs & imaging results that were available during my care of the patient were reviewed by me and considered in my medical decision making (see chart for details).  Clinical impression: 2 days of URI symptoms.  Patient is febrile.  She has COVID exposure.  Treatment plan: 1.  The findings and treatment plan were discussed in detail with the patient.  The patient was in agreement. 2.  We will go ahead and send off a COVID test to the hospital.  Informed her it could take anywhere from 24 to 48 hours to come back.  She should log into MyChart to get her results. 3.  We will go ahead and test her for influenza given that her symptoms have been present since yesterday.  She was febrile to 102.  She is not vaccinated against influenza.  She was negative for influenza A and B. 4.  We will prescribe an albuterol inhaler.  I encouraged her to take Mucinex 1200 mg twice daily.  Also she can purchase some over-the-counter cough medicine.  We will also give her a spacer.  Given her wheeze on exam I will go ahead and give her a Z-Pak to cover atypical organisms. 5.  I gave her a work note keeping her out of work until January 13.  She  will need a negative documented COVID test to go back to work.  If she is positive someone will call her and extend her quarantine and her work note. 6.  Red flag signs or symptoms were discussed in detail with the patient and when to seek out immediate medical attention.  She voiced verbal understanding. 7.  Follow-up here as needed.    Final Clinical Impressions(s) / UC Diagnoses   Final diagnoses:  Cough  Fever, unspecified  Myalgia     Discharge Instructions     We will go ahead and send off a COVID test to the hospital.  Informed her it could take anywhere from 24 to 48 hours to come back.  She should log into MyChart to get her results. We will go ahead and test her for influenza given that her symptoms have been present since yesterday.  She was febrile to 102.  She is not vaccinated against influenza.  She was negative for influenza A and B. We will prescribe an albuterol inhaler.  I encouraged her to take Mucinex 1200 mg twice daily.  Also she can purchase some over-the-counter cough medicine.  We will also give her a spacer. Given her wheeze on exam I will go ahead and give her a Z-Pak to cover atypical organisms. I gave her a work note keeping her  out of work until January 13.  She will need a negative documented COVID test to go back to work.  If she is positive someone will call her and extend her quarantine and her work note. Red flag signs or symptoms were discussed in detail with the patient and when to seek out immediate medical attention.  She voiced verbal understanding. Follow-up here as needed.    ED Prescriptions    Medication Sig Dispense Auth. Provider   albuterol (VENTOLIN HFA) 108 (90 Base) MCG/ACT inhaler Inhale 1-2 puffs into the lungs every 6 (six) hours as needed for wheezing or shortness of breath. 1 each Delton See, MD   Spacer/Aero-Holding Chambers (AEROCHAMBER PLUS) inhaler Use as instructed 1 each Delton See, MD   azithromycin (ZITHROMAX) 250  MG tablet Take 1 tablet (250 mg total) by mouth daily. Take first 2 tablets together, then 1 every day until finished. 6 tablet Delton See, MD     PDMP not reviewed this encounter.   Delton See, MD 03/21/20 1500

## 2020-03-21 NOTE — Discharge Instructions (Signed)
We will go ahead and send off a COVID test to the hospital.  Informed her it could take anywhere from 24 to 48 hours to come back.  She should log into MyChart to get her results. We will go ahead and test her for influenza given that her symptoms have been present since yesterday.  She was febrile to 102.  She is not vaccinated against influenza.  She was negative for influenza A and B. We will prescribe an albuterol inhaler.  I encouraged her to take Mucinex 1200 mg twice daily.  Also she can purchase some over-the-counter cough medicine.  We will also give her a spacer. Given her wheeze on exam I will go ahead and give her a Z-Pak to cover atypical organisms. I gave her a work note keeping her out of work until January 13.  She will need a negative documented COVID test to go back to work.  If she is positive someone will call her and extend her quarantine and her work note. Red flag signs or symptoms were discussed in detail with the patient and when to seek out immediate medical attention.  She voiced verbal understanding. Follow-up here as needed.

## 2020-03-21 NOTE — ED Triage Notes (Addendum)
Pt with cough, headache and sore throat starting yesterday. Non productive cough. Endorses low grade fever yesterday. Co-worker tested positive for COVID

## 2020-03-22 LAB — SARS CORONAVIRUS 2 (TAT 6-24 HRS): SARS Coronavirus 2: POSITIVE — AB

## 2020-08-18 DIAGNOSIS — Z8709 Personal history of other diseases of the respiratory system: Secondary | ICD-10-CM | POA: Insufficient documentation

## 2021-03-11 NOTE — L&D Delivery Note (Signed)
Obstetrical Delivery Note   Date of Delivery:   12/09/2021 Primary OB:   Other [facility]  Gestational Age/EDD: [redacted]w[redacted]d (Dated by Marvis Moeller) Reason for Admission: Labor Antepartum complications: gestational diabetes, diet controlled   Delivered By:   Lawanda Cousins, CNM   Delivery Type:   vaginal birth after cesarean (VBAC)  Delivery Details:   Arrived in early active labor, received labor epidural. AROM at [phone] for scant bloody/mec tinged fluid. FSE and IUPC placed, amnioinfusion started for variables. Pt began spontaneously bearing down at around 0420. Pushed in hands knees, made consistent progress with each contraction, then repositioned to right tilt.  SVB of viable female at 88.  Infant birthed OA to LOA cord around neck noted, birthed through, infant unwound from cord, spontaneous cry while being placed on mother's abdomen, slightly poor tone noted, nursery RN requesting infant to be taken to warmer, cord double clamped and cut, infant taken to warmer for assessment-see note. Placenta delivered spontaneously, in inspection of perineum a 1st degree vaginal laceration found and repaired with 2-0 Vicryl, left labial repaired with 4-0 Vicryl, lidocaine given prior to repairs.  All areas hemostatic. Infant then transported to to [facility] for observation. Mother stable.  Anesthesia:    local and epidural Intrapartum complications: Gestational Diabetes, diet controlled, Non-reassuring Fetal Status, and preeclampsia  GBS:    Negative Laceration:    vaginal and labial Episiotomy:    none Rectal exam:   Deferred, hemorrhoids  present  Placenta:    Delivered and expressed via active management. Intact: yes. Mec stained To pathology: no.  Delayed Cord Clamping: no Estimated Blood Loss:  178ml  Baby:    Liveborn female, APGARs 7/9, weight 5lbs 3 oz  Roberto Scales, CNM  [facility], [facility]  @TODAY @  5:39 AM

## 2021-04-10 ENCOUNTER — Other Ambulatory Visit: Payer: Self-pay

## 2021-04-10 ENCOUNTER — Encounter: Payer: Self-pay | Admitting: Obstetrics and Gynecology

## 2021-04-10 ENCOUNTER — Ambulatory Visit: Payer: 59 | Admitting: Obstetrics and Gynecology

## 2021-04-10 VITALS — BP 129/69 | HR 97 | Ht 60.0 in | Wt 192.2 lb

## 2021-04-10 DIAGNOSIS — Z3200 Encounter for pregnancy test, result unknown: Secondary | ICD-10-CM

## 2021-04-10 DIAGNOSIS — Z98891 History of uterine scar from previous surgery: Secondary | ICD-10-CM | POA: Diagnosis not present

## 2021-04-10 DIAGNOSIS — O09521 Supervision of elderly multigravida, first trimester: Secondary | ICD-10-CM | POA: Diagnosis not present

## 2021-04-10 LAB — POCT URINE PREGNANCY: Preg Test, Ur: POSITIVE — AB

## 2021-04-10 NOTE — Progress Notes (Signed)
Patient presenting for pregnancy confirmation today. UPT resulted positive. LMP was 03/08/21. Patient states pregnancy was not planned. Patient states anxiousness due to prior pregnancy sickness and that her husband is undergoing cancer treatment. Patient states no questions at this time.

## 2021-04-10 NOTE — Progress Notes (Signed)
HPI:      [name] is a 36 y.o. [id number] who LMP was Patient's last menstrual period was 03/08/2021.  Subjective:   She presents today for possible pregnancy.  She was not attempting.  Her husband is receiving chemotherapy and she was not sure if she could actually become pregnant.  She is also concerned because her last pregnancy ended in preterm delivery by cesarean for placental abruption.  She has also had a first trimester miscarriage. She is currently taking prenatal vitamins.    Hx: The following portions of the patient's history were reviewed and updated as appropriate:             She  has a past medical history of Asthma, Asthma, Headache, Migraines, Seizures ([place]), and Seizures ([place]). She does not have any pertinent problems on file. She  has a past surgical history that includes Cesarean section (2009) and I & D extremity (Right, 08/24/2015). Her family history includes Breast cancer in her maternal aunt; Cervical cancer in her sister; Heart block in her father. She  reports that she has been smoking cigarettes. She has a 3.50 pack-year smoking history. She has never used smokeless tobacco. She reports current alcohol use. She reports that she does not use drugs. She has a current medication list which includes the following prescription(s): albuterol, multivitamin-prenatal, azithromycin, diphenhydramine, aerochamber plus, and sulfamethoxazole-trimethoprim. She is allergic to sulfa antibiotics.       Review of Systems:  Review of Systems  Constitutional: Denied constitutional symptoms, night sweats, recent illness, fatigue, fever, insomnia and weight loss.  Eyes: Denied eye symptoms, eye pain, photophobia, vision change and visual disturbance.  Ears/Nose/Throat/Neck: Denied ear, nose, throat or neck symptoms, hearing loss, nasal discharge, sinus congestion and sore throat.  Cardiovascular: Denied cardiovascular symptoms, arrhythmia, chest pain/pressure, edema, exercise  intolerance, orthopnea and palpitations.  Respiratory: Denied pulmonary symptoms, asthma, pleuritic pain, productive sputum, cough, dyspnea and wheezing.  Gastrointestinal: Denied, gastro-esophageal reflux, melena, nausea and vomiting.  Genitourinary: Denied genitourinary symptoms including symptomatic vaginal discharge, pelvic relaxation issues, and urinary complaints.  Musculoskeletal: Denied musculoskeletal symptoms, stiffness, swelling, muscle weakness and myalgia.  Dermatologic: Denied dermatology symptoms, rash and scar.  Neurologic: Denied neurology symptoms, dizziness, headache, neck pain and syncope.  Psychiatric: Denied psychiatric symptoms, anxiety and depression.  Endocrine: Denied endocrine symptoms including hot flashes and night sweats.   Meds:   Current Outpatient Medications on File Prior to Visit  Medication Sig Dispense Refill   albuterol (VENTOLIN HFA) 108 (90 Base) MCG/ACT inhaler Inhale 1-2 puffs into the lungs every 6 (six) hours as needed for wheezing or shortness of breath. 1 each 0   Prenatal Vit-Fe Fumarate-FA (MULTIVITAMIN-PRENATAL) 27-0.8 MG TABS tablet Take 1 tablet by mouth daily at 12 noon.     azithromycin (ZITHROMAX) 250 MG tablet Take 1 tablet (250 mg total) by mouth daily. Take first 2 tablets together, then 1 every day until finished. (Patient not taking: Reported on 04/10/2021) 6 tablet 0   diphenhydrAMINE (SOMINEX) 25 MG tablet Take 25 mg by mouth at bedtime as needed for sleep. (Patient not taking: Reported on 04/10/2021)     Spacer/Aero-Holding Chambers (AEROCHAMBER PLUS) inhaler Use as instructed (Patient not taking: Reported on 04/10/2021) 1 each 2   sulfamethoxazole-trimethoprim (BACTRIM DS,SEPTRA DS) 800-160 MG tablet Take 1 tablet by mouth 2 (two) times daily. (Patient not taking: Reported on 04/28/2017) 20 tablet 0   No current facility-administered medications on file prior to visit.      Objective:  Vitals:   04/10/21 0811  BP: 129/69   Pulse: 97   Filed Weights   04/10/21 0811  Weight: 192 lb 3.2 oz (87.2 kg)              Urinary pregnancy test positive          Assessment:    [id number] Patient Active Problem List   Diagnosis Date Noted   Spon abortion w/o complication [id number]   Cellulitis of right arm 08/25/2015   Cellulitis of arm, right 08/24/2015     1. Possible pregnancy, not yet confirmed   2. History of cesarean delivery   3. Multigravida of advanced maternal age in first trimester     Very early pregnancy.   Plan:            Prenatal Plan 1.  The patient was given prenatal literature. 2.  She was continued on prenatal vitamins. 3.  A prenatal lab panel to be drawn at nurse visit. 4.  An ultrasound was ordered to better determine an EDC. 5.  A nurse visit was scheduled. 6.  Genetic testing and testing for other inheritable conditions discussed in detail. She will decide in the future whether to have these labs performed. 7.  A general overview of pregnancy testing, visit schedule, ultrasound schedule, and prenatal care was discussed. 8.  COVID and its risks associated with pregnancy, prevention by limiting exposure and use of masks, as well as the risks and benefits of vaccination during pregnancy were discussed in detail.  [facility] policy regarding office and hospital visitation and testing was explained. 9.  Benefits of breast-feeding discussed in detail including both maternal and infant benefits. Ready Set Baby website discussed.      10.  We have discussed history of cesarean delivery and history of abruption.  We have briefly discussed the possibility of TOLAC.  All questions were answered.      11.  Begin ASA at 13 weeks Orders Placed This Encounter  Procedures   US OB Comp Less 14 Wks   POCT urine pregnancy    No orders of the defined types were placed in this encounter.     F/U  Return in about 8 weeks (around 06/05/2021). I spent 33 minutes involved in the care of this patient  preparing to see the patient by obtaining and reviewing her medical history (including labs, imaging tests and prior procedures), documenting clinical information in the electronic health record (EHR), counseling and coordinating care plans, writing and sending prescriptions, ordering tests or procedures and in direct communicating with the patient and medical staff discussing pertinent items from her history and physical exam.  Finis Bud, M.D. 04/10/2021 8:47 AM

## 2021-04-23 ENCOUNTER — Ambulatory Visit (INDEPENDENT_AMBULATORY_CARE_PROVIDER_SITE_OTHER): Payer: 59

## 2021-04-23 ENCOUNTER — Other Ambulatory Visit: Payer: Self-pay

## 2021-04-23 DIAGNOSIS — Z3200 Encounter for pregnancy test, result unknown: Secondary | ICD-10-CM | POA: Diagnosis not present

## 2021-04-23 DIAGNOSIS — Z98891 History of uterine scar from previous surgery: Secondary | ICD-10-CM | POA: Diagnosis not present

## 2021-05-11 ENCOUNTER — Ambulatory Visit (INDEPENDENT_AMBULATORY_CARE_PROVIDER_SITE_OTHER): Payer: 59 | Admitting: Obstetrics and Gynecology

## 2021-05-11 ENCOUNTER — Other Ambulatory Visit: Payer: Self-pay

## 2021-05-11 VITALS — BP 122/85 | HR 90 | Wt 185.1 lb

## 2021-05-11 DIAGNOSIS — Z3481 Encounter for supervision of other normal pregnancy, first trimester: Secondary | ICD-10-CM

## 2021-05-11 DIAGNOSIS — Z113 Encounter for screening for infections with a predominantly sexual mode of transmission: Secondary | ICD-10-CM

## 2021-05-11 DIAGNOSIS — Z3A09 9 weeks gestation of pregnancy: Secondary | ICD-10-CM | POA: Diagnosis not present

## 2021-05-11 DIAGNOSIS — Z0283 Encounter for blood-alcohol and blood-drug test: Secondary | ICD-10-CM | POA: Diagnosis not present

## 2021-05-11 LAB — OB RESULTS CONSOLE VARICELLA ZOSTER ANTIBODY, IGG: Varicella: IMMUNE

## 2021-05-11 NOTE — Progress Notes (Signed)
Yvonne Reid presents for NOB nurse interview visit. Pregnancy confirmation done 04/10/2021.  [id number]. Pregnancy education material explained and given. NOB labs ordered. Body mass index is 36.15 kg/m?Marland Kitchen HIV labs and Drug screen were explained optional and she did not decline. Drug screen ordered. PNV encouraged. Genetic screening options discussed. Genetic testing: Unsure.  Pt to discuss with provider.  Financial policy reviewed. FMLA from reviewed and signed. Pt. To follow up with provider in 3 weeks for NOB physical.  Pt expressed concerns of hemorrhoids and was given information via pregnancy packet on recommended treatments. Pt would like to discuss at next visit with provider that she had questions/concerns on conceiving around the time of her husbands first treatment of immunotherapy 'Nivolumab' and if that will impact pregnancy at all.  ?

## 2021-05-12 LAB — VIRAL HEPATITIS HBV, HCV
HCV Ab: NONREACTIVE
Hep B Core Total Ab: NEGATIVE
Hep B Surface Ab, Qual: NONREACTIVE
Hepatitis B Surface Ag: NEGATIVE

## 2021-05-12 LAB — MICROSCOPIC EXAMINATION
Casts: NONE SEEN /lpf
RBC, Urine: NONE SEEN /hpf (ref 0–2)

## 2021-05-12 LAB — URINALYSIS, ROUTINE W REFLEX MICROSCOPIC
Bilirubin, UA: NEGATIVE
Glucose, UA: NEGATIVE
Ketones, UA: NEGATIVE
Nitrite, UA: NEGATIVE
RBC, UA: NEGATIVE
Specific Gravity, UA: 1.019 (ref 1.005–1.030)
Urobilinogen, Ur: 1 mg/dL (ref 0.2–1.0)
pH, UA: 7 (ref 5.0–7.5)

## 2021-05-12 LAB — RUBELLA SCREEN: Rubella Antibodies, IGG: 8.24 index (ref >0.99)

## 2021-05-12 LAB — HIV ANTIBODY (ROUTINE TESTING W REFLEX): HIV Screen 4th Generation wRfx: NONREACTIVE

## 2021-05-12 LAB — TOXOPLASMA ANTIBODIES- IGG AND  IGM
Toxoplasma Antibody- IgM: <3 AU/mL (ref 0.0–7.9)
Toxoplasma IgG Ratio: <3 IU/mL (ref 0.0–7.1)

## 2021-05-12 LAB — RPR: RPR Ser Ql: NONREACTIVE

## 2021-05-12 LAB — ABO AND RH: Rh Factor: POSITIVE

## 2021-05-12 LAB — HCV INTERPRETATION

## 2021-05-12 LAB — VARICELLA ZOSTER ANTIBODY, IGG: Varicella zoster IgG: 2516 index (ref >165)

## 2021-05-12 LAB — ANTIBODY SCREEN: Antibody Screen: NEGATIVE

## 2021-05-13 LAB — URINE CULTURE, OB REFLEX: Organism ID, Bacteria: NO GROWTH

## 2021-05-13 LAB — GC/CHLAMYDIA PROBE AMP
Chlamydia trachomatis, NAA: NEGATIVE
Neisseria Gonorrhoeae by PCR: NEGATIVE

## 2021-05-13 LAB — CULTURE, OB URINE

## 2021-05-14 LAB — MONITOR DRUG PROFILE 14(MW)
Amphetamine Scrn, Ur: NEGATIVE ng/mL
BARBITURATE SCREEN URINE: NEGATIVE ng/mL
BENZODIAZEPINE SCREEN, URINE: NEGATIVE ng/mL
Buprenorphine, Urine: NEGATIVE ng/mL
CANNABINOIDS UR QL SCN: NEGATIVE ng/mL
Cocaine (Metab) Scrn, Ur: NEGATIVE ng/mL
Creatinine(Crt), U: 161.2 mg/dL (ref 20.0–300.0)
Fentanyl, Urine: NEGATIVE pg/mL
Meperidine Screen, Urine: NEGATIVE ng/mL
Methadone Screen, Urine: NEGATIVE ng/mL
OXYCODONE+OXYMORPHONE UR QL SCN: NEGATIVE ng/mL
Opiate Scrn, Ur: NEGATIVE ng/mL
Ph of Urine: 7.2 (ref 4.5–8.9)
Phencyclidine Qn, Ur: NEGATIVE ng/mL
Propoxyphene Scrn, Ur: NEGATIVE ng/mL
SPECIFIC GRAVITY: 1.015
Tramadol Screen, Urine: NEGATIVE ng/mL

## 2021-05-14 LAB — NICOTINE SCREEN, URINE: Cotinine Ql Scrn, Ur: POSITIVE ng/mL — AB

## 2021-05-22 ENCOUNTER — Encounter: Payer: Self-pay | Admitting: Obstetrics and Gynecology

## 2021-06-06 ENCOUNTER — Other Ambulatory Visit: Payer: Self-pay

## 2021-06-06 ENCOUNTER — Ambulatory Visit (INDEPENDENT_AMBULATORY_CARE_PROVIDER_SITE_OTHER): Payer: 59 | Admitting: Obstetrics and Gynecology

## 2021-06-06 ENCOUNTER — Encounter: Payer: Self-pay | Admitting: Obstetrics and Gynecology

## 2021-06-06 ENCOUNTER — Other Ambulatory Visit (HOSPITAL_COMMUNITY)
Admission: RE | Admit: 2021-06-06 | Discharge: 2021-06-06 | Disposition: A | Discharge status: Home / Self Care | Payer: 59 | Source: Ambulatory Visit | Attending: Obstetrics and Gynecology | Admitting: Obstetrics and Gynecology

## 2021-06-06 VITALS — BP 119/84 | HR 91 | Wt 186.7 lb

## 2021-06-06 DIAGNOSIS — Z1379 Encounter for other screening for genetic and chromosomal anomalies: Secondary | ICD-10-CM

## 2021-06-06 DIAGNOSIS — Z124 Encounter for screening for malignant neoplasm of cervix: Secondary | ICD-10-CM | POA: Diagnosis not present

## 2021-06-06 DIAGNOSIS — Z3A12 12 weeks gestation of pregnancy: Secondary | ICD-10-CM

## 2021-06-06 DIAGNOSIS — Z3481 Encounter for supervision of other normal pregnancy, first trimester: Secondary | ICD-10-CM

## 2021-06-06 LAB — POCT URINALYSIS DIPSTICK OB
Bilirubin, UA: NEGATIVE
Blood, UA: NEGATIVE
Glucose, UA: NEGATIVE
Ketones, UA: NEGATIVE
Leukocytes, UA: NEGATIVE
Nitrite, UA: NEGATIVE
POC,PROTEIN,UA: NEGATIVE
Spec Grav, UA: 1.025 (ref 1.010–1.025)
Urobilinogen, UA: 0.2 E.U./dL
pH, UA: 6 (ref 5.0–8.0)

## 2021-06-06 NOTE — Progress Notes (Signed)
NOB: Patient still has nausea and vomiting but is keeping liquids down most of the time and also eating some time.  She thinks she is starting to feel better.  MaterniT 21 today.  Pap performed.  Patient to begin ASA today. ? ?Physical examination ?General NAD, Conversant  ?HEENT Atraumatic; Op clear with mmm.  Normo-cephalic. Pupils reactive. Anicteric sclerae  ?Thyroid/Neck Smooth without nodularity or enlargement. Normal ROM.  Neck Supple.  ?Skin No rashes, lesions or ulceration. Normal palpated skin turgor. No nodularity.  ?Breasts: No masses or discharge.  Symmetric.  No axillary adenopathy.  ?Lungs: Clear to auscultation.No rales or wheezes. Normal Respiratory effort, no retractions.  ?Heart: NSR.  No murmurs or rubs appreciated. No periferal edema  ?Abdomen: Soft.  Non-tender.  No masses.  No HSM. No hernia  ?Extremities: Moves all appropriately.  Normal ROM for age. No lymphadenopathy.  ?Neuro: Oriented to PPT.  Normal mood. Normal affect.  ? ?  Pelvic:   ?Vulva: Normal appearance.  No lesions.  ?Vagina: No lesions or abnormalities noted.  ?Support: Normal pelvic support.  ?Urethra No masses tenderness or scarring.  ?Meatus Normal size without lesions or prolapse.  ?Cervix: Normal appearance.  No lesions.  ?Anus: Normal exam.  No lesions.  ?Perineum: Normal exam.  No lesions.  ?      Bimanual   ?Adnexae: No masses.  Non-tender to palpation.  ?Uterus: Enlarged.  159 bpm non-tender.  Mobile.  AV.  ?Adnexae: No masses.  Non-tender to palpation.  ?Cul-de-sac: Negative for abnormality.  ?Adnexae: No masses.  Non-tender to palpation.  ? ?      Pelvimetry   ?Diagonal: Reached.  ?Spines: Average.  ?Sacrum: Concave.  ?Pubic Arch: Normal.  ? ? ? ?

## 2021-06-06 NOTE — Progress Notes (Signed)
Patient presents today for New OB physical. Patient states she is still sick throughout the day, trying lemon juice/gingerale and ginger candy. Patient is due for her pap smear, ordered. Patient wants genetic testing, Materniti21 ordered. Patient states no other questions or concerns at this time.  ? ?

## 2021-06-07 LAB — CYTOLOGY - PAP
Comment: NEGATIVE
Diagnosis: NEGATIVE
High risk HPV: POSITIVE — AB

## 2021-06-10 LAB — MATERNIT21  PLUS CORE+ESS+SCA, BLOOD

## 2021-06-13 NOTE — Progress Notes (Signed)
Landi: ?Your Pap smear is positive for HPV.  However, your cells show no changes from this infection.  There is nothing to do during your pregnancy.  I would recommend a follow-up Pap smear approximately 3 months after birth.

## 2021-06-20 ENCOUNTER — Emergency Department
Admission: EM | Admit: 2021-06-20 | Discharge: 2021-06-20 | Disposition: A | Discharge status: Home / Self Care | Payer: 59 | Attending: Student in an Organized Health Care Education/Training Program | Admitting: Student in an Organized Health Care Education/Training Program

## 2021-06-20 ENCOUNTER — Other Ambulatory Visit: Payer: Self-pay

## 2021-06-20 DIAGNOSIS — O219 Vomiting of pregnancy, unspecified: Secondary | ICD-10-CM | POA: Diagnosis present

## 2021-06-20 DIAGNOSIS — Z3A15 15 weeks gestation of pregnancy: Secondary | ICD-10-CM | POA: Diagnosis not present

## 2021-06-20 LAB — COMPREHENSIVE METABOLIC PANEL
ALT: 26 U/L (ref 0–44)
AST: 31 U/L (ref 15–41)
Albumin: 3.1 g/dL — ABNORMAL LOW (ref 3.5–5.0)
Alkaline Phosphatase: 65 U/L (ref 38–126)
Anion gap: 5 (ref 5–15)
BUN: 6 mg/dL (ref 6–20)
CO2: 22 mmol/L (ref 22–32)
Calcium: 8.5 mg/dL — ABNORMAL LOW (ref 8.9–10.3)
Chloride: 109 mmol/L (ref 98–111)
Creatinine, Ser: 0.41 mg/dL — ABNORMAL LOW (ref 0.44–1.00)
GFR, Estimated: >60 mL/min (ref >60)
Glucose, Bld: 86 mg/dL (ref 70–99)
Potassium: 3.5 mmol/L (ref 3.5–5.1)
Sodium: 136 mmol/L (ref 135–145)
Total Bilirubin: 0.3 mg/dL (ref 0.3–1.2)
Total Protein: 6.2 g/dL — ABNORMAL LOW (ref 6.5–8.1)

## 2021-06-20 LAB — CBC
HCT: 37.3 % (ref 36.0–46.0)
Hemoglobin: 12.4 g/dL (ref 12.0–15.0)
MCH: 28.1 pg (ref 26.0–34.0)
MCHC: 33.2 g/dL (ref 30.0–36.0)
MCV: 84.4 fL (ref 80.0–100.0)
Platelets: 282 10*3/uL (ref 150–400)
RBC: 4.42 MIL/uL (ref 3.87–5.11)
RDW: 12.9 % (ref 11.5–15.5)
WBC: 11.3 10*3/uL — ABNORMAL HIGH (ref 4.0–10.5)
nRBC: 0 % (ref 0.0–0.2)

## 2021-06-20 LAB — URINALYSIS, ROUTINE W REFLEX MICROSCOPIC
Bilirubin Urine: NEGATIVE
Glucose, UA: NEGATIVE mg/dL
Hgb urine dipstick: NEGATIVE
Ketones, ur: NEGATIVE mg/dL
Nitrite: NEGATIVE
Protein, ur: NEGATIVE mg/dL
Specific Gravity, Urine: 1.015 (ref 1.005–1.030)
pH: 7 (ref 5.0–8.0)

## 2021-06-20 LAB — LIPASE, BLOOD: Lipase: 33 U/L (ref 11–51)

## 2021-06-20 LAB — POC URINE PREG, ED: Preg Test, Ur: POSITIVE — AB

## 2021-06-20 MED ORDER — SODIUM CHLORIDE 0.9 % IV BOLUS
1000.0000 mL | Freq: Once | INTRAVENOUS | Status: AC
  Administered 2021-06-20: 1000 mL via INTRAVENOUS

## 2021-06-20 MED ORDER — ONDANSETRON 4 MG PO TBDP: 4.0000 mg | ORAL_TABLET | Freq: Three times a day (TID) | ORAL | 0 refills | Status: DC | PRN

## 2021-06-20 MED ORDER — ONDANSETRON HCL 4 MG/2ML IJ SOLN
4.0000 mg | Freq: Once | INTRAMUSCULAR | Status: AC
  Administered 2021-06-20: 4 mg via INTRAVENOUS
  Filled 2021-06-20: qty 2

## 2021-06-20 NOTE — ED Provider Notes (Signed)
? ?[facility] ?Provider Note ? ? ? Event Date/Time  ? First MD Initiated Contact with Patient 06/20/21 1245   ?  (approximate) ? ? ?History  ? ?Emesis During Pregnancy ? ? ?HPI ? ?Yvonne Reid is a 36 y.o. female  [id number] roughly [redacted] weeks pregnant who presents for evaluation of nausea vomiting for past several days.  No diarrhea no cough no congestion no measured fevers has had some chills.  Has trouble keeping anything down.  Has not taken anything for the nausea at home.  Denies any discharge or vaginal bleeding. ?  ? ?  ? ? ?Physical Exam  ? ?Triage Vital Signs: ?ED Triage Vitals [06/20/21 1230]  ?Enc Vitals Group  ?   BP 117/77  ?   Pulse Rate 88  ?   Resp 18  ?   Temp 98.2 ?F (36.8 ?C)  ?   Temp Source Oral  ?   SpO2 96 %  ?   Weight 180 lb (81.6 kg)  ?   Height   ?   Head Circumference   ?   Peak Flow   ?   Pain Score 0  ?   Pain Loc   ?   Pain Edu?   ?   Excl. in GC?   ? ? ?Most recent vital signs: ?Vitals:  ? 06/20/21 1230  ?BP: 117/77  ?Pulse: 88  ?Resp: 18  ?Temp: 98.2 ?F (36.8 ?C)  ?SpO2: 96%  ? ? ? ?Constitutional: Alert  ?Eyes: Conjunctivae are normal.  ?Head: Atraumatic. ?Nose: No congestion/rhinnorhea. ?Mouth/Throat: Mucous membranes are moist.   ?Neck: Painless ROM.  ?Cardiovascular:   Good peripheral circulation. ?Respiratory: Normal respiratory effort.  No retractions.  ?Gastrointestinal: Soft and nontender.  No guarding or rebound, gravid ?Musculoskeletal:  no deformity ?Neurologic:  MAE spontaneously. No gross focal neurologic deficits are appreciated.  ?Skin:  Skin is warm, dry and intact. No rash noted. ?Psychiatric: Mood and affect are normal. Speech and behavior are normal. ? ? ? ?ED Results / Procedures / Treatments  ? ?Labs ?(all labs ordered are listed, but only abnormal results are displayed) ?Labs Reviewed  ?COMPREHENSIVE METABOLIC PANEL - Abnormal; Notable for the following components:  ?    Result Value  ? Creatinine, Ser 0.41 (*)   ? Calcium 8.5 (*)   ?  Total Protein 6.2 (*)   ? Albumin 3.1 (*)   ? All other components within normal limits  ?CBC - Abnormal; Notable for the following components:  ? WBC 11.3 (*)   ? All other components within normal limits  ?URINALYSIS, ROUTINE W REFLEX MICROSCOPIC - Abnormal; Notable for the following components:  ? Color, Urine YELLOW (*)   ? APPearance CLOUDY (*)   ? Leukocytes,Ua SMALL (*)   ? Bacteria, UA RARE (*)   ? All other components within normal limits  ?POC URINE PREG, ED - Abnormal; Notable for the following components:  ? Preg Test, Ur POSITIVE (*)   ? All other components within normal limits  ?LIPASE, BLOOD  ? ? ? ?EKG ? ? ? ? ?RADIOLOGY ? ? ? ?PROCEDURES: ? ?Critical Care performed: No ? ?Procedures ? ? ?MEDICATIONS ORDERED IN ED: ?Medications  ?sodium chloride 0.9 % bolus 1,000 mL (0 mLs Intravenous Stopped 06/20/21 1347)  ?ondansetron (ZOFRAN) injection 4 mg (4 mg Intravenous Given 06/20/21 1256)  ?sodium chloride 0.9 % bolus 1,000 mL (0 mLs Intravenous Stopped 06/20/21 1459)  ? ? ? ?IMPRESSION / MDM / ASSESSMENT  AND PLAN / ED COURSE  ?I reviewed the triage vital signs and the nursing notes. ?             ?               ? ?Differential diagnosis includes, but is not limited to, enteritis, gastritis, hyperemesis, dehydration, stone, SBO, electrolyte abnormality ? ?Patient presents to the ER for nausea vomiting in early pregnancy.  Clinically nontoxic-appearing abdominal exam soft benign.  No white count no significant electrolyte abnormality will give IV fluids.  She is feeling improved after IV Zofran. ?Clinical Course as of 06/20/21 1511  ?Wed Jun 20, 2021  ?1426 Reassessed.  Bedside ultrasound shows reassuring fetal heart tones and movement.  Patient feeling improved after IV fluids and Zofran will give 1 more liter then anticipate patient will be appropriate for outpatient follow-up. [PR]  ?  ?Clinical Course User Index ?[PR] Willy Eddy, MD  ? ? ? ?FINAL CLINICAL IMPRESSION(S) / ED DIAGNOSES  ? ?Final  diagnoses:  ?Nausea and vomiting in pregnancy  ? ? ? ?Rx / DC Orders  ? ?ED Discharge Orders   ? ?      Ordered  ?  ondansetron (ZOFRAN-ODT) 4 MG disintegrating tablet  Every 8 hours PRN       ? 06/20/21 1426  ? ?  ?  ? ?  ? ? ? ?Note:  This document was prepared using Dragon voice recognition software and may include unintentional dictation errors. ? ?  ?Willy Eddy, MD ?06/20/21 1511 ? ?

## 2021-06-20 NOTE — ED Notes (Signed)
Pt given a cup of apple juice for fluid challenge.  ?

## 2021-06-20 NOTE — ED Triage Notes (Signed)
Pt comes pov with emesis during her pregnancy. Denies fevers. Has not had abnormal emesis until last Saturday. Nonstop puking since then. [redacted] weeks pregnant.  ?

## 2021-07-04 ENCOUNTER — Encounter: Payer: Self-pay | Admitting: Obstetrics and Gynecology

## 2021-07-04 ENCOUNTER — Ambulatory Visit (INDEPENDENT_AMBULATORY_CARE_PROVIDER_SITE_OTHER): Payer: 59 | Admitting: Obstetrics and Gynecology

## 2021-07-04 VITALS — BP 124/71 | HR 102 | Wt 181.3 lb

## 2021-07-04 DIAGNOSIS — Z98891 History of uterine scar from previous surgery: Secondary | ICD-10-CM | POA: Insufficient documentation

## 2021-07-04 DIAGNOSIS — Z3482 Encounter for supervision of other normal pregnancy, second trimester: Secondary | ICD-10-CM

## 2021-07-04 DIAGNOSIS — Z8759 Personal history of other complications of pregnancy, childbirth and the puerperium: Secondary | ICD-10-CM

## 2021-07-04 DIAGNOSIS — O26899 Other specified pregnancy related conditions, unspecified trimester: Secondary | ICD-10-CM

## 2021-07-04 DIAGNOSIS — Z3A16 16 weeks gestation of pregnancy: Secondary | ICD-10-CM

## 2021-07-04 DIAGNOSIS — R102 Pelvic and perineal pain: Secondary | ICD-10-CM

## 2021-07-04 DIAGNOSIS — O0992 Supervision of high risk pregnancy, unspecified, second trimester: Secondary | ICD-10-CM

## 2021-07-04 DIAGNOSIS — O09523 Supervision of elderly multigravida, third trimester: Secondary | ICD-10-CM | POA: Insufficient documentation

## 2021-07-04 DIAGNOSIS — O09522 Supervision of elderly multigravida, second trimester: Secondary | ICD-10-CM

## 2021-07-04 DIAGNOSIS — Z1379 Encounter for other screening for genetic and chromosomal anomalies: Secondary | ICD-10-CM

## 2021-07-04 LAB — POCT URINALYSIS DIPSTICK OB
Bilirubin, UA: NEGATIVE
Blood, UA: NEGATIVE
Glucose, UA: NEGATIVE
Ketones, UA: NEGATIVE
Leukocytes, UA: NEGATIVE
Nitrite, UA: NEGATIVE
POC,PROTEIN,UA: NEGATIVE
Spec Grav, UA: 1.01 (ref 1.010–1.025)
Urobilinogen, UA: 0.2 E.U./dL
pH, UA: 6.5 (ref 5.0–8.0)

## 2021-07-04 NOTE — Progress Notes (Signed)
ROB: She has some concerns about a rash on the lower part of her left leg. She has been keep it clean and dry and using hydrocortisone cream. She states that it itches and burns really bad. She reports she is taking two 81 mg aspirin daily. ?

## 2021-07-04 NOTE — Progress Notes (Signed)
ROB: Notes some round ligament pain.  Also reports rash on her left ankle for the past 3-4 days after some swelling noted. Using hydrocortisone cream.  Discussed TOLAC vs repeat C-section, notes she would like to try TOLAC.  Got to 9 cm with last labor prior to C-section. Reports trauma from last C-section experience.  ? ?RTC in 4 weeks, for anatomy scan then. AFP done today.  ?

## 2021-07-04 NOTE — Patient Instructions (Signed)

## 2021-07-09 LAB — AFP, SERUM, OPEN SPINA BIFIDA
AFP MoM: 1.04
AFP Value: 35.9 ng/mL
Gest. Age on Collection Date: 16.9 weeks
Maternal Age At EDD: 35.8 yr
OSBR Risk 1 IN: 10000
Test Results:: NEGATIVE
Weight: 181 [lb_av]

## 2021-08-07 ENCOUNTER — Encounter: Payer: Self-pay | Admitting: Obstetrics and Gynecology

## 2021-08-07 ENCOUNTER — Ambulatory Visit (INDEPENDENT_AMBULATORY_CARE_PROVIDER_SITE_OTHER): Payer: 59

## 2021-08-07 ENCOUNTER — Ambulatory Visit (INDEPENDENT_AMBULATORY_CARE_PROVIDER_SITE_OTHER): Payer: 59 | Admitting: Obstetrics and Gynecology

## 2021-08-07 VITALS — BP 124/77 | HR 105 | Wt 190.4 lb

## 2021-08-07 DIAGNOSIS — O0992 Supervision of high risk pregnancy, unspecified, second trimester: Secondary | ICD-10-CM

## 2021-08-07 DIAGNOSIS — Z3A21 21 weeks gestation of pregnancy: Secondary | ICD-10-CM

## 2021-08-07 LAB — POCT URINALYSIS DIPSTICK OB
Bilirubin, UA: NEGATIVE
Blood, UA: NEGATIVE
Glucose, UA: NEGATIVE
Ketones, UA: NEGATIVE
Leukocytes, UA: NEGATIVE
Nitrite, UA: NEGATIVE
POC,PROTEIN,UA: NEGATIVE
Spec Grav, UA: 1.01 (ref 1.010–1.025)
Urobilinogen, UA: 0.2 E.U./dL
pH, UA: 7.5 (ref 5.0–8.0)

## 2021-08-07 NOTE — Progress Notes (Signed)
ROB. Patient states starting to feel fetal movement within the last week. Anatomy ultrasound preformed today. Patient states she would like a TOLAC but is a little nervous. Patient states no questions or concerns at this time.

## 2021-08-07 NOTE — Progress Notes (Signed)
ROB: No problems.  Ultrasound today normal.  Patient reassured.  Feeling daily fetal movement follow-up with midwives at next visit.  Soon after next visit patient will need 1 hour GCT.

## 2021-09-06 ENCOUNTER — Ambulatory Visit (INDEPENDENT_AMBULATORY_CARE_PROVIDER_SITE_OTHER): Payer: 59 | Admitting: Certified Nurse Midwife

## 2021-09-06 ENCOUNTER — Encounter: Payer: Self-pay | Admitting: Certified Nurse Midwife

## 2021-09-06 VITALS — BP 112/76 | HR 102 | Wt 188.6 lb

## 2021-09-06 DIAGNOSIS — Z3A26 26 weeks gestation of pregnancy: Secondary | ICD-10-CM

## 2021-09-06 LAB — POCT URINALYSIS DIPSTICK OB
Bilirubin, UA: NEGATIVE
Blood, UA: NEGATIVE
Glucose, UA: NEGATIVE
Ketones, UA: NEGATIVE
Leukocytes, UA: NEGATIVE
Nitrite, UA: NEGATIVE
POC,PROTEIN,UA: NEGATIVE
Spec Grav, UA: 1.015 (ref 1.010–1.025)
Urobilinogen, UA: 0.2 E.U./dL
pH, UA: 6.5 (ref 5.0–8.0)

## 2021-09-06 NOTE — Progress Notes (Signed)
ROB doing well, feeling good movement. C/o back pain and round ligament pain. Self help measures reviewed. Discussed glucose testing next visit, handout given. She verbalizes and agrees to plan . Follow up 2 wk with Missy.   Doreene Burke, CNM

## 2021-09-06 NOTE — Patient Instructions (Signed)
Oral Glucose Tolerance Test During Pregnancy Why am I having this test? The oral glucose tolerance test (OGTT) is done to check how your body processes blood sugar (glucose). This is one of several tests used to diagnose diabetes that develops during pregnancy (gestational diabetes mellitus). Gestational diabetes is a short-term form of diabetes that some women develop while they are pregnant. It usually occurs during the second trimester of pregnancy and goes away after delivery. Testing, or screening, for gestational diabetes usually occurs at weeks 24-28 of pregnancy. You may have the OGTT test after having a 1-hour glucose screening test if the results from that test indicate that you may have gestational diabetes. This test may also be needed if: You have a history of gestational diabetes. There is a history of giving birth to very large babies or of losing pregnancies (having stillbirths). You have signs and symptoms of diabetes, such as: Changes in your eyesight. Tingling or numbness in your hands or feet. Changes in hunger, thirst, and urination, and these are not explained by your pregnancy. What is being tested? This test measures the amount of glucose in your blood at different times during a period of 3 hours. This shows how well your body can process glucose. What kind of sample is taken?  Blood samples are required for this test. They are usually collected by inserting a needle into a blood vessel. How do I prepare for this test? For 3 days before your test, eat normally. Have plenty of carbohydrate-rich foods. Follow instructions from your health care provider about: Eating or drinking restrictions on the day of the test. You may be asked not to eat or drink anything other than water (to fast) starting 8-10 hours before the test. Changing or stopping your regular medicines. Some medicines may interfere with this test. Tell a health care provider about: All medicines you are  taking, including vitamins, herbs, eye drops, creams, and over-the-counter medicines. Any blood disorders you have. Any surgeries you have had. Any medical conditions you have. What happens during the test? First, your blood glucose will be measured. This is referred to as your fasting blood glucose because you fasted before the test. Then, you will drink a glucose solution that contains a certain amount of glucose. Your blood glucose will be measured again 1, 2, and 3 hours after you drink the solution. This test takes about 3 hours to complete. You will need to stay at the testing location during this time. During the testing period: Do not eat or drink anything other than the glucose solution. Do not exercise. Do not use any products that contain nicotine or tobacco, such as cigarettes, e-cigarettes, and chewing tobacco. These can affect your test results. If you need help quitting, ask your health care provider. The testing procedure may vary among health care providers and hospitals. How are the results reported? Your results will be reported as milligrams of glucose per deciliter of blood (mg/dL) or millimoles per liter (mmol/L). There is more than one source for screening and diagnosis reference values used to diagnose gestational diabetes. Your health care provider will compare your results to normal values that were established after testing a large group of people (reference values). Reference values may vary among labs and hospitals. For this test (Carpenter-Coustan), reference values are: Fasting: 95 mg/dL (5.3 mmol/L). 1 hour: 180 mg/dL (10.0 mmol/L). 2 hour: 155 mg/dL (8.6 mmol/L). 3 hour: 140 mg/dL (7.8 mmol/L). What do the results mean? Results below the reference values are   considered normal. If two or more of your blood glucose levels are at or above the reference values, you may be diagnosed with gestational diabetes. If only one level is high, your health care provider may  suggest repeat testing or other tests to confirm a diagnosis. Talk with your health care provider about what your results mean. Questions to ask your health care provider Ask your health care provider, or the department that is doing the test: When will my results be ready? How will I get my results? What are my treatment options? What other tests do I need? What are my next steps? Summary The oral glucose tolerance test (OGTT) is one of several tests used to diagnose diabetes that develops during pregnancy (gestational diabetes mellitus). Gestational diabetes is a short-term form of diabetes that some women develop while they are pregnant. You may have the OGTT test after having a 1-hour glucose screening test if the results from that test show that you may have gestational diabetes. You may also have this test if you have any symptoms or risk factors for this type of diabetes. Talk with your health care provider about what your results mean. This information is not intended to replace advice given to you by your health care provider. Make sure you discuss any questions you have with your health care provider. Document Revised: 08/05/2019 Document Reviewed: 08/05/2019 Elsevier Patient Education  2023 Elsevier Inc.  

## 2021-09-20 ENCOUNTER — Other Ambulatory Visit: Payer: Self-pay

## 2021-09-20 DIAGNOSIS — Z3A28 28 weeks gestation of pregnancy: Secondary | ICD-10-CM

## 2021-09-21 ENCOUNTER — Ambulatory Visit (INDEPENDENT_AMBULATORY_CARE_PROVIDER_SITE_OTHER): Payer: 59 | Admitting: Obstetrics

## 2021-09-21 ENCOUNTER — Encounter: Payer: Self-pay | Admitting: Obstetrics

## 2021-09-21 ENCOUNTER — Other Ambulatory Visit: Payer: 59

## 2021-09-21 VITALS — BP 103/70 | HR 90 | Wt 189.4 lb

## 2021-09-21 DIAGNOSIS — Z23 Encounter for immunization: Secondary | ICD-10-CM

## 2021-09-21 DIAGNOSIS — Z3A28 28 weeks gestation of pregnancy: Secondary | ICD-10-CM

## 2021-09-21 DIAGNOSIS — O0993 Supervision of high risk pregnancy, unspecified, third trimester: Secondary | ICD-10-CM

## 2021-09-21 LAB — POCT URINALYSIS DIPSTICK OB
Bilirubin, UA: NEGATIVE
Blood, UA: NEGATIVE
Glucose, UA: NEGATIVE
Ketones, UA: NEGATIVE
Leukocytes, UA: NEGATIVE
Nitrite, UA: NEGATIVE
POC,PROTEIN,UA: NEGATIVE
Spec Grav, UA: 1.015 (ref 1.010–1.025)
Urobilinogen, UA: 0.2 E.U./dL
pH, UA: 6.5 (ref 5.0–8.0)

## 2021-09-21 NOTE — Progress Notes (Signed)
ROB at [redacted]w[redacted]d. Active baby. Some BH ctx. Denies vaginal bleeding and LOF. TDaP, RSB, BTC today. Discussed making a birth plan. Wants TOLAC. Encouraged CBE. Would like BTL PP. Plans to breastfeed.  1-hour glucose, RPR, CBC today. RTC in 2 weeks.  Guadlupe Spanish, CNM

## 2021-09-22 LAB — CBC
Hematocrit: 34.4 % (ref 34.0–46.6)
Hemoglobin: 11.8 g/dL (ref 11.1–15.9)
MCH: 29.5 pg (ref 26.6–33.0)
MCHC: 34.3 g/dL (ref 31.5–35.7)
MCV: 86 fL (ref 79–97)
Platelets: 327 10*3/uL (ref 150–450)
RBC: 4 x10E6/uL (ref 3.77–5.28)
RDW: 13 % (ref 11.7–15.4)
WBC: 11.5 10*3/uL — ABNORMAL HIGH (ref 3.4–10.8)

## 2021-09-22 LAB — RPR: RPR Ser Ql: NONREACTIVE

## 2021-09-22 LAB — GLUCOSE, 1 HOUR GESTATIONAL: Gestational Diabetes Screen: 160 mg/dL — ABNORMAL HIGH (ref 70–139)

## 2021-09-23 ENCOUNTER — Encounter: Payer: Self-pay | Admitting: Obstetrics

## 2021-09-24 ENCOUNTER — Other Ambulatory Visit: Payer: Self-pay

## 2021-09-24 DIAGNOSIS — Z131 Encounter for screening for diabetes mellitus: Secondary | ICD-10-CM

## 2021-09-25 ENCOUNTER — Other Ambulatory Visit: Payer: 59

## 2021-09-26 LAB — GESTATIONAL GLUCOSE TOLERANCE
Glucose, Fasting: 72 mg/dL (ref 70–94)
Glucose, GTT - 1 Hour: 194 mg/dL — ABNORMAL HIGH (ref 70–179)
Glucose, GTT - 2 Hour: 175 mg/dL — ABNORMAL HIGH (ref 70–154)
Glucose, GTT - 3 Hour: 76 mg/dL (ref 70–139)

## 2021-09-28 ENCOUNTER — Other Ambulatory Visit: Payer: Self-pay | Admitting: Obstetrics

## 2021-09-28 ENCOUNTER — Encounter: Payer: Self-pay | Admitting: Obstetrics

## 2021-09-28 DIAGNOSIS — O24410 Gestational diabetes mellitus in pregnancy, diet controlled: Secondary | ICD-10-CM

## 2021-09-28 MED ORDER — BLOOD GLUCOSE MONITOR KIT: PACK | 2 refills | Status: DC

## 2021-09-28 NOTE — Progress Notes (Signed)
+  3-hour glucose. Referral to nutrition/diabetes education sent. Glucometer and test strips rx sent to pharmacy. [place] notified via MyChart.  M.Chryl Heck, CNM

## 2021-10-03 ENCOUNTER — Other Ambulatory Visit: Payer: Self-pay

## 2021-10-03 ENCOUNTER — Telehealth: Payer: Self-pay | Admitting: Certified Nurse Midwife

## 2021-10-03 DIAGNOSIS — O24912 Unspecified diabetes mellitus in pregnancy, second trimester: Secondary | ICD-10-CM

## 2021-10-03 NOTE — Telephone Encounter (Signed)
Pt called stating that the pharmacy is requesting a separate Rx for lances and test strips. They are asking for quantity needed per month. Confirmed pharmacy as [facility] in [place]. Please advise.

## 2021-10-10 ENCOUNTER — Ambulatory Visit (INDEPENDENT_AMBULATORY_CARE_PROVIDER_SITE_OTHER): Payer: 59 | Admitting: Certified Nurse Midwife

## 2021-10-10 VITALS — BP 111/75 | HR 96 | Wt 189.3 lb

## 2021-10-10 DIAGNOSIS — Z3A30 30 weeks gestation of pregnancy: Secondary | ICD-10-CM

## 2021-10-10 NOTE — Progress Notes (Signed)
ROB doing well, reviewed GDM diagnosis. Pt has nutrition consult on 8/15. Pt to follow up with [place] on 8/18 to review BS log. She verbalizes and agrees. Pt state she would like BTL, consent reviewed and signed today. Discussed possible need for additional antenatal testing ( should she start medications). She verbalizes and agrees to plan of care. Follow up 8/18 and as scheduled 8/28.    Doreene Burke, CNM

## 2021-10-10 NOTE — Patient Instructions (Signed)
Gestational Diabetes Mellitus, Self-Care When you have gestational diabetes mellitus, you must make sure your blood sugar (glucose) stays at a healthy level. What are the risks? If you do not get treated for this condition, it may cause problems for you and your unborn baby. For the mother Giving birth to the baby early. Having problems during labor and when giving birth. Needing surgery to give birth to the baby (cesarean delivery). Having problems with blood pressure. Getting this form of diabetes again when pregnant. Getting type 2 diabetes in the future. For the baby Low blood sugar. Bigger body size than is normal. Breathing problems. How to monitor blood sugar Check your blood sugar every day while you are pregnant. Check it as often as told by your doctor. To do this: Wash your hands with soap and water for at least 20 seconds. Prick the side of your finger (not the tip) with the lancet. Use a different finger each time. Gently rub the finger until a small drop of blood appears. Follow instructions that come with your meter for: Putting in the test strip. Putting blood on the strip. Getting the result. Write down your result and any notes. In general, your blood sugar levels should be: 95 mg/dL (5.3 mmol/L) if you have not eaten. 140 mg/dL (7.8 mmol/L) 1 hour after a meal. 120 mg/dL (6.7 mmol/L) 2 hours after a meal. Follow these instructions at home: Medicines Take over-the-counter and prescription medicines only as told by your doctor. If your doctor prescribed insulin or other diabetes medicines: Take them every day. Do not run out of insulin or other medicines. Plan ahead so you always have them. Eating and drinking  Follow instructions from your doctor about eating or drinking restrictions. See a food expert (dietician) to help you create an eating plan that helps control your blood sugar. The foods in this plan will include: Low-fat proteins. Dried beans, nuts,  and whole grain breads, cereals, or pasta. Fresh fruits and vegetables. Low-fat dairy products. Healthy fats. Eat healthy snacks between healthy meals. Drink enough fluid to keep your pee (urine) pale yellow. Keep track of carbs that you eat. To do this: Read food labels. Learn the serving sizes of foods. Follow your sick day plan when you cannot eat or drink normally. Make this plan with your doctor so it is ready to use. Activity Do exercises as told by your doctor. Exercise for 30 or more minutes a day, or as much as your doctor recommends. To help you control blood sugar levels after a meal: Do 10 minutes of exercise after each meal. Start this exercise 30 minutes after the meal. Talk with your doctor before you start a new exercise. Your doctor may tell you to change your insulin, other medicines, or food. Lifestyle Do not drink alcohol. Do not use any products that contain nicotine or tobacco, such as cigarettes, e-cigarettes, and chewing tobacco. If you need help quitting, ask your doctor. Learn how to deal with stress. If you need help with this, ask your doctor. Body care Stay up to date with your shots (vaccines). Take good care of your teeth. To do this: Brush your teeth and gums two times a day. Floss one or more times a day. Go to the dentist one or more times every 6 months. Stay at a healthy weight while you are pregnant. General instructions Ask your doctor about risks of high blood pressure in pregnancy. Share your diabetes care plan with: Your work or school. People  you live with. Check your pee for ketones: When you are sick. As told by your doctor. Carry a card or wear a bracelet that says you have diabetes. Keep all follow-up visits. Care after giving birth Have your blood sugar checked 4-12 weeks after you give birth. Get checked for diabetes one or more times every 3 years or as told. Where to find more information American Diabetes Association (ADA):  diabetes.org Association of Diabetes Care & Education Specialists (ADCES): diabeteseducator.org Centers for Disease Control and Prevention (CDC): TonerPromos.no American Pregnancy Association: americanpregnancy.org U.[place]. Department of Agriculture MyPlate: WrestlingReporter.dk Contact a doctor if: Your blood sugar is above your target for two tests in a row. You have a fever. You are sick for 2 days or more and do not get better. You have either of these problems for more than 6 hours: You vomit every time you eat or drink. You have watery poop (diarrhea). Get help right away if: You cannot think clearly. You have trouble breathing. You have moderate or high ketones in your pee. Blood or abnormal fluid starts to come out of your vagina. You feel your baby is not moving as usual. You start having early contractions. You may feel your belly tighten. You have a very bad headache. These symptoms may be an emergency. Get help right away. Call your local emergency services (911 in the [place] Do not wait to see if the symptoms will go away. Do not drive yourself to the hospital. Summary Check your blood sugar (glucose) while you are pregnant. Check it as often as told by your doctor. Take your insulin and diabetes medicines as told. Have your blood sugar checked 4-12 weeks after you give birth. Keep all follow-up visits. This information is not intended to replace advice given to you by your health care provider. Make sure you discuss any questions you have with your health care provider. Document Revised: 08/02/2019 Document Reviewed: 08/02/2019 Elsevier Patient Education  2023 [employer].

## 2021-10-22 ENCOUNTER — Ambulatory Visit (INDEPENDENT_AMBULATORY_CARE_PROVIDER_SITE_OTHER): Payer: 59 | Admitting: Obstetrics

## 2021-10-22 ENCOUNTER — Encounter: Payer: Self-pay | Admitting: Obstetrics

## 2021-10-22 VITALS — BP 126/80 | HR 99 | Wt 190.0 lb

## 2021-10-22 DIAGNOSIS — O0993 Supervision of high risk pregnancy, unspecified, third trimester: Secondary | ICD-10-CM

## 2021-10-22 DIAGNOSIS — Z3A32 32 weeks gestation of pregnancy: Secondary | ICD-10-CM

## 2021-10-22 LAB — POCT URINALYSIS DIPSTICK OB
Bilirubin, UA: NEGATIVE
Blood, UA: NEGATIVE
Glucose, UA: NEGATIVE
Ketones, UA: NEGATIVE
Leukocytes, UA: NEGATIVE
Nitrite, UA: NEGATIVE
POC,PROTEIN,UA: NEGATIVE
Spec Grav, UA: 1.02 (ref 1.010–1.025)
Urobilinogen, UA: 0.2 E.U./dL
pH, UA: 6 (ref 5.0–8.0)

## 2021-10-22 NOTE — Progress Notes (Signed)
ROB at [redacted]w[redacted]d. Active baby. Denies ctx, LOF, and vaginl bleeding. Reviewed prior preterm birth at 1 weeks. Yvonne Reid has not been tracking her blood sugars much over the past week. She has difficulty checking her post-breakfast level d/t commute. Fasting this AM was 100.  She has a nutrition appointment on 10/26/21. Encouraged to log at least 3 BS daily, including fasting. Discussed high-protein snacks. She is walking several times during the day. Reviewed comfort measures for swelling in feet. Reports severe reflux unrelieved by Tums and dietary changes. Recommend omeprazole. Reviewed s/s of preterm labor and when to go to the hospital. Baby feels breech by Leopold's. Discussed Spinning Babies. Needs TOLAC consent signed. RTC in 2 weeks.  Yvonne Reid Spanish, CNM

## 2021-10-23 ENCOUNTER — Encounter: Payer: Self-pay | Admitting: *Deleted

## 2021-10-23 ENCOUNTER — Encounter: Payer: 59 | Attending: Obstetrics | Admitting: *Deleted

## 2021-10-23 VITALS — BP 122/80 | Ht 61.0 in | Wt 190.5 lb

## 2021-10-23 DIAGNOSIS — Z713 Dietary counseling and surveillance: Secondary | ICD-10-CM | POA: Insufficient documentation

## 2021-10-23 DIAGNOSIS — Z3A32 32 weeks gestation of pregnancy: Secondary | ICD-10-CM | POA: Insufficient documentation

## 2021-10-23 DIAGNOSIS — O24410 Gestational diabetes mellitus in pregnancy, diet controlled: Secondary | ICD-10-CM | POA: Diagnosis present

## 2021-10-23 DIAGNOSIS — Z833 Family history of diabetes mellitus: Secondary | ICD-10-CM | POA: Diagnosis not present

## 2021-10-23 NOTE — Patient Instructions (Signed)
Read booklet on Gestational Diabetes Follow Gestational Meal Planning Guidelines Don't skip meals - eat 1 protein and 1 carbohydrate serving Allow 2-3 hours between meals and snacks Complete a 3 Day Food Record and bring to next appointment Check blood sugars 4 x day - before breakfast and 2 hrs after every meal and record  Bring blood sugar log to all appointments Purchase urine ketone strips if instructed by MD and check urine ketones every am:  If + increase bedtime snack to 1 protein and 2 carbohydrate servings Walk 20-30 minutes at least 5 x week if permitted by MD

## 2021-10-23 NOTE — Progress Notes (Signed)
Diabetes Self-Management Education  Visit Type: First/Initial  Appt. Start Time: 0835 Appt. End Time: 0945  10/23/2021  [name], identified by name and date of birth, is a 36 y.o. female with a diagnosis of Diabetes: Gestational Diabetes.   ASSESSMENT  Blood pressure 122/80, height 5\' 1"  (1.549 m), weight 190 lb 8 oz (86.4 kg), last menstrual period 03/08/2021, estimated date of delivery 12/13/2021. Body mass index is 35.99 kg/m.   Diabetes Self-Management Education - 10/23/21 0952       Visit Information   Visit Type First/Initial      Initial Visit   Diabetes Type Gestational Diabetes    Date Diagnosed 3 weeks ago    Are you currently following a meal plan? Yes    What type of meal plan do you follow? "limited bread and sugar intake"    Are you taking your medications as prescribed? Yes      Health Coping   How would you rate your overall health? Good      Psychosocial Assessment   Patient Belief/Attitude about Diabetes Other (comment)   "expected it - not a complete shock"   What is the hardest part about your diabetes right now, causing you the most concern, or is the most worrisome to you about your diabetes?   Making healty food and beverage choices;Checking blood sugar    Self-care barriers None    Self-management support Doctor's office;Family    Patient Concerns Nutrition/Meal planning;Glycemic Control    Special Needs None    Preferred Learning Style Auditory;Visual;Hands on    Learning Readiness Change in progress    How often do you need to have someone help you when you read instructions, pamphlets, or other written materials from your doctor or pharmacy? 1 - Never    What is the last grade level you completed in school? some college      Pre-Education Assessment   Patient understands the diabetes disease and treatment process. Needs Instruction    Patient understands incorporating nutritional management into lifestyle. Needs Review    Patient  undertands incorporating physical activity into lifestyle. Needs Instruction    Patient understands using medications safely. Needs Instruction    Patient understands monitoring blood glucose, interpreting and using results Needs Review    Patient understands prevention, detection, and treatment of acute complications. Needs Instruction    Patient understands prevention, detection, and treatment of chronic complications. Needs Instruction    Patient understands how to develop strategies to address psychosocial issues. Needs Instruction    Patient understands how to develop strategies to promote health/change behavior. Needs Instruction      Complications   How often do you check your blood sugar? 1-2 times/day   no readings from 8/3 - 8/13   Fasting Blood glucose range (mg/dL) 07-07-1968   42-706 mg/dL - 1/5 elevated   Postprandial Blood glucose range (mg/dL) 23-762   pp breakfast 109,97 mg/dL; pp lunch 90, 86, 83-151;761-607 mg/dL; pp supper 97, 371 mg/dL higher readings related to fast food choices   Have you had a dilated eye exam in the past 12 months? No    Have you had a dental exam in the past 12 months? Yes    Are you checking your feet? No      Dietary Intake   Breakfast biscuit with eggs and cheese; Grek yogurt granola bar, fruit - grapes, apple, banana, peaches, plums, berries    Snack (morning) 5-6 snacks/day - beef jery, nabs, string cheese, 062  yogurt    Lunch skips or eats a snack - sometimes has salad with tuna, ham/cheese or peanut butter sandwich    Dinner chicken, beef, pork, moderate fish; potatoes, corn, rice, occasional green beans, broccoli, cauliflower, zucchini, squash, tomatoes, cuccumbers - many foods from garden now    Praxair) water, unsweetened tea, black coffee, diet soda      Activity / Exercise   Activity / Exercise Type ADL's      Patient Education   Previous Diabetes Education No    Disease Pathophysiology Definition of diabetes, type 1 and 2, and the  diagnosis of diabetes;Factors that contribute to the development of diabetes;Explored patient's options for treatment of their diabetes    Healthy Eating Role of diet in the treatment of diabetes and the relationship between the three main macronutrients and blood glucose level;Food label reading, portion sizes and measuring food.;Reviewed blood glucose goals for pre and post meals and how to evaluate the patients' food intake on their blood glucose level.    Being Active Role of exercise on diabetes management, blood pressure control and cardiac health.    Medications Other (comment)   Limited use of oral medications during pregnancy and potential for insulin   Monitoring Purpose and frequency of SMBG.;Taught/discussed recording of test results and interpretation of SMBG.;Identified appropriate SMBG and/or A1C goals.;Ketone testing, when, how.    Chronic complications Relationship between chronic complications and blood glucose control    Diabetes Stress and Support Identified and addressed patients feelings and concerns about diabetes    Preconception care Pregnancy and GDM  Role of pre-pregnancy blood glucose control on the development of the fetus;Reviewed with patient blood glucose goals with pregnancy;Role of family planning for patients with diabetes      Individualized Goals (developed by patient)   Reducing Risk Other (comment)   improve blood sugars     Outcomes   Expected Outcomes Demonstrated interest in learning. Expect positive outcomes    Future DMSE 2 wks         Individualized Plan for Diabetes Self-Management Training:   Learning Objective:  Patient will have a greater understanding of diabetes self-management. Patient education plan is to attend individual and/or group sessions per assessed needs and concerns.   Plan:   Read booklet on Gestational Diabetes Follow Gestational Meal Planning Guidelines Don't skip meals - eat 1 protein and 1 carbohydrate serving Allow 2-3  hours between meals and snacks Complete a 3 Day Food Record and bring to next appointment Check blood sugars 4 x day - before breakfast and 2 hrs after every meal and record  Bring blood sugar log to all appointments Purchase urine ketone strips if instructed by MD and check urine ketones every am:  If + increase bedtime snack to 1 protein and 2 carbohydrate servings Walk 20-30 minutes at least 5 x week if permitted by MD  Expected Outcomes:  Demonstrated interest in learning. Expect positive outcomes  Education material provided: Gestational Booklet Gestational Meal Planning Guidelines Simple Meal Plan 3 Day Food Record Goals for a Healthy Pregnancy  If problems or questions, patient to contact team via:   Sharion Settler, RN, CCM, CDCES [phone]  Future DSME appointment: 2 wks August 30 with the dietitian

## 2021-10-26 ENCOUNTER — Encounter: Payer: Self-pay | Admitting: Obstetrics and Gynecology

## 2021-10-26 ENCOUNTER — Ambulatory Visit (INDEPENDENT_AMBULATORY_CARE_PROVIDER_SITE_OTHER): Payer: 59 | Admitting: Obstetrics and Gynecology

## 2021-10-26 VITALS — BP 125/80 | HR 89 | Wt 192.3 lb

## 2021-10-26 DIAGNOSIS — O24410 Gestational diabetes mellitus in pregnancy, diet controlled: Secondary | ICD-10-CM | POA: Insufficient documentation

## 2021-10-26 DIAGNOSIS — Z3009 Encounter for other general counseling and advice on contraception: Secondary | ICD-10-CM

## 2021-10-26 DIAGNOSIS — Z98891 History of uterine scar from previous surgery: Secondary | ICD-10-CM

## 2021-10-26 DIAGNOSIS — Z8759 Personal history of other complications of pregnancy, childbirth and the puerperium: Secondary | ICD-10-CM

## 2021-10-26 DIAGNOSIS — O0993 Supervision of high risk pregnancy, unspecified, third trimester: Secondary | ICD-10-CM | POA: Insufficient documentation

## 2021-10-26 NOTE — Progress Notes (Signed)
ROB: reports having some mild Braxton Hicks over the past 3 weeks but otherwise doing well. Reviewed blood sugar logs, most wnl, 1 elevated fasting of 91, 1 elevated postprandial of 141.  Continue to encourage management with diet at this time.  Will need to begin growth scan and antenatal testing at 36 weeks.  Counseled again regarding TOLAC vs RCS; risks/benefits discussed in detail. All questions answered.  Patient elects for TOLAC, consent signed 10/26/2021.  Plans to to breastfeed.  Undecided for pain management for labor.  RTC in 2 weeks.

## 2021-10-26 NOTE — Patient Instructions (Signed)
Third Trimester of Pregnancy ? ?The third trimester of pregnancy is from week 28 through week 40. This is also called months 7 through 9. This trimester is when your unborn baby (fetus) is growing very fast. At the end of the ninth month, the unborn baby is about 20 inches long. It weighs about 6-10 pounds. ?Body changes during your third trimester ?Your body continues to go through many changes during this time. The changes vary and generally return to normal after the baby is born. ?Physical changes ?Your weight will continue to increase. You may gain 25-35 pounds (11-16 kg) by the end of the pregnancy. If you are underweight, you may gain 28-40 lb (about 13-18 kg). If you are overweight, you may gain 15-25 lb (about 7-11 kg). ?You may start to get stretch marks on your hips, belly (abdomen), and breasts. ?Your breasts will continue to grow and may hurt. A yellow fluid (colostrum) may leak from your breasts. This is the first milk you are making for your baby. ?You may have changes in your hair. ?Your belly button may stick out. ?You may have more swelling in your hands, face, or ankles. ?Health changes ?You may have heartburn. ?You may have trouble pooping (constipation). ?You may get hemorrhoids. These are swollen veins in the butt that can itch or get painful. ?You may have swollen veins (varicose veins) in your legs. ?You may have more body aches in the pelvis, back, or thighs. ?You may have more tingling or numbness in your hands, arms, and legs. The skin on your belly may also feel numb. ?You may feel short of breath as your womb (uterus) gets bigger. ?Other changes ?You may pee (urinate) more often. ?You may have more problems sleeping. ?You may notice the unborn baby "dropping," or moving lower in your belly. ?You may have more discharge coming from your vagina. ?Your joints may feel loose, and you may have pain around your pelvic bone. ?Follow these instructions at home: ?Medicines ?Take over-the-counter  and prescription medicines only as told by your doctor. Some medicines are not safe during pregnancy. ?Take a prenatal vitamin that contains at least 600 micrograms (mcg) of folic acid. ?Eating and drinking ?Eat healthy meals that include: ?Fresh fruits and vegetables. ?Whole grains. ?Good sources of protein, such as meat, eggs, or tofu. ?Low-fat dairy products. ?Avoid raw meat and unpasteurized juice, milk, and cheese. These carry germs that can harm you and your baby. ?Eat 4 or 5 small meals rather than 3 large meals a day. ?You may need to take these actions to prevent or treat trouble pooping: ?Drink enough fluids to keep your pee (urine) pale yellow. ?Eat foods that are high in fiber. These include beans, whole grains, and fresh fruits and vegetables. ?Limit foods that are high in fat and sugar. These include fried or sweet foods. ?Activity ?Exercise only as told by your doctor. Stop exercising if you start to have cramps in your womb. ?Avoid heavy lifting. ?Do not exercise if it is too hot or too humid, or if you are in a place of great height (high altitude). ?If you choose to, you may have sex unless your doctor tells you not to. ?Relieving pain and discomfort ?Take breaks often, and rest with your legs raised (elevated) if you have leg cramps or low back pain. ?Take warm water baths (sitz baths) to soothe pain or discomfort caused by hemorrhoids. Use hemorrhoid cream if your doctor approves. ?Wear a good support bra if your breasts are   tender. ?If you develop bulging, swollen veins in your legs: ?Wear support hose as told by your doctor. ?Raise your feet for 15 minutes, 3-4 times a day. ?Limit salt in your food. ?Safety ?Talk to your doctor before traveling far distances. ?Do not use hot tubs, steam rooms, or saunas. ?Wear your seat belt at all times when you are in a car. ?Talk with your doctor if someone is hurting you or yelling at you a lot. ?Preparing for your baby's arrival ?To prepare for the arrival  of your baby: ?Take prenatal classes. ?Visit the hospital and tour the maternity area. ?Buy a rear-facing car seat. Learn how to install it in your car. ?Prepare the baby's room. Take out all pillows and stuffed animals from the baby's crib. ?General instructions ?Avoid cat litter boxes and soil used by cats. These carry germs that can cause harm to the baby and can cause a loss of your baby by miscarriage or stillbirth. ?Do not douche or use tampons. Do not use scented sanitary pads. ?Do not smoke or use any products that contain nicotine or tobacco. If you need help quitting, ask your doctor. ?Do not drink alcohol. ?Do not use herbal medicines, illegal drugs, or medicines that were not approved by your doctor. Chemicals in these products can affect your baby. ?Keep all follow-up visits. This is important. ?Where to find more information ?American Pregnancy Association: americanpregnancy.org ?American College of Obstetricians and Gynecologists: www.acog.org ?Office on Women's Health: womenshealth.gov/pregnancy ?Contact a doctor if: ?You have a fever. ?You have mild cramps or pressure in your lower belly. ?You have a nagging pain in your belly area. ?You vomit, or you have watery poop (diarrhea). ?You have bad-smelling fluid coming from your vagina. ?You have pain when you pee, or your pee smells bad. ?You have a headache that does not go away when you take medicine. ?You have changes in how you see, or you see spots in front of your eyes. ?Get help right away if: ?Your water breaks. ?You have regular contractions that are less than 5 minutes apart. ?You are spotting or bleeding from your vagina. ?You have very bad belly cramps or pain. ?You have trouble breathing. ?You have chest pain. ?You faint. ?You have not felt the baby move for the amount of time told by your doctor. ?You have new or increased pain, swelling, or redness in an arm or leg. ?Summary ?The third trimester is from week 28 through week 40 (months 7  through 9). This is the time when your unborn baby is growing very fast. ?During this time, your discomfort may increase as you gain weight and as your baby grows. ?Get ready for your baby to arrive by taking prenatal classes, buying a rear-facing car seat, and preparing the baby's room. ?Get help right away if you are bleeding from your vagina, you have chest pain and trouble breathing, or you have not felt the baby move for the amount of time told by your doctor. ?This information is not intended to replace advice given to you by your health care provider. Make sure you discuss any questions you have with your health care provider. ?Document Revised: 08/04/2019 Document Reviewed: 06/10/2019 ?Elsevier Patient Education ? 2023 Elsevier Inc. ? ?

## 2021-10-26 NOTE — Assessment & Plan Note (Signed)
BTL consent form signed 10/10/2021

## 2021-10-26 NOTE — Progress Notes (Signed)
ROB: She is here today to review her blood sugar logs. She has no new concerns.

## 2021-11-05 ENCOUNTER — Encounter: Payer: 59 | Admitting: Certified Nurse Midwife

## 2021-11-07 ENCOUNTER — Encounter: Payer: 59 | Admitting: Dietician

## 2021-11-07 ENCOUNTER — Encounter: Payer: Self-pay | Admitting: Dietician

## 2021-11-07 VITALS — BP 114/72 | Ht 61.0 in | Wt 192.0 lb

## 2021-11-07 DIAGNOSIS — O24410 Gestational diabetes mellitus in pregnancy, diet controlled: Secondary | ICD-10-CM | POA: Diagnosis not present

## 2021-11-07 NOTE — Progress Notes (Signed)
Patient's BG record indicates fasting BGs ranging 77-93, and post-meal BGs ranging 70-124 + 3 of 30 readings between 135 ant 148 related to higher carb meals and/or stress per patient. Patient's food diary indicates meals and snacks at regular intervals (a lunch meal is difficult at times due to work schedule, but she does include 2 or more snacks if unable to eat a meal); inclusion of protein and low carb veg with meals; carbohydrate intake low with most meals.  Provided basic balanced meal plan, and instructed on inclusion of small to moderate carb amounts with meals, discussed healthy options. Instructed patient on food safety, including avoidance of Listeriosis, and limiting mercury from fish. Discussed importance of maintaining healthy lifestyle habits to reduce risk of Type 2 DM as well as Gestational DM with any future pregnancies. Advised patient to use any remaining testing supplies to test some BGs after delivery, and to have BG tested ideally annually. Patient is not planning on any future pregnancies.

## 2021-11-07 NOTE — Patient Instructions (Signed)
Continue to work on eating a meal or snack every 3-4 hours during the day, keeping healthy snack options on hand.  Include at least a small amount of carb with each meal, 15-20grams, but not more than 45grams. Keep up the daily movement to help with blood sugar control and stress management.

## 2021-11-08 ENCOUNTER — Encounter: Payer: Self-pay | Admitting: Obstetrics and Gynecology

## 2021-11-08 ENCOUNTER — Ambulatory Visit (INDEPENDENT_AMBULATORY_CARE_PROVIDER_SITE_OTHER): Payer: 59 | Admitting: Obstetrics and Gynecology

## 2021-11-08 VITALS — BP 125/87 | HR 99 | Wt 194.2 lb

## 2021-11-08 DIAGNOSIS — Z98891 History of uterine scar from previous surgery: Secondary | ICD-10-CM

## 2021-11-08 DIAGNOSIS — Z3A35 35 weeks gestation of pregnancy: Secondary | ICD-10-CM

## 2021-11-08 DIAGNOSIS — O24410 Gestational diabetes mellitus in pregnancy, diet controlled: Secondary | ICD-10-CM

## 2021-11-08 DIAGNOSIS — O0993 Supervision of high risk pregnancy, unspecified, third trimester: Secondary | ICD-10-CM

## 2021-11-08 LAB — POCT URINALYSIS DIPSTICK OB
Bilirubin, UA: NEGATIVE
Blood, UA: NEGATIVE
Glucose, UA: NEGATIVE
Ketones, UA: NEGATIVE
Leukocytes, UA: NEGATIVE
Nitrite, UA: NEGATIVE
POC,PROTEIN,UA: NEGATIVE
Spec Grav, UA: 1.015 (ref 1.010–1.025)
Urobilinogen, UA: 0.2 E.U./dL
pH, UA: 6 (ref 5.0–8.0)

## 2021-11-08 NOTE — Progress Notes (Signed)
ROB. Patient states daily fetal movement and increased pelvic pressure. She is continuing to use diet control to manage GDM, also seeing [facility]. Patient states complaints regarding bilateral foot swelling. Patient states no questions or concerns at this time.

## 2021-11-08 NOTE — Progress Notes (Signed)
ROB: Has daily lower extremity swelling.  Discussed decrease salt intake increase fluids, elevation of legs.  Reports daily fetal movement taking aspirin and vitamins as directed.  Sugar log discussed.  Patient with excellent glycemic control.  Cultures next visit.

## 2021-11-14 ENCOUNTER — Encounter: Payer: Self-pay | Admitting: Obstetrics and Gynecology

## 2021-11-14 ENCOUNTER — Ambulatory Visit (INDEPENDENT_AMBULATORY_CARE_PROVIDER_SITE_OTHER): Payer: 59 | Admitting: Obstetrics and Gynecology

## 2021-11-14 ENCOUNTER — Other Ambulatory Visit (HOSPITAL_COMMUNITY)
Admission: RE | Admit: 2021-11-14 | Discharge: 2021-11-14 | Disposition: A | Discharge status: Home / Self Care | Payer: 59 | Source: Ambulatory Visit | Attending: Obstetrics and Gynecology | Admitting: Obstetrics and Gynecology

## 2021-11-14 ENCOUNTER — Ambulatory Visit (INDEPENDENT_AMBULATORY_CARE_PROVIDER_SITE_OTHER): Payer: 59

## 2021-11-14 VITALS — BP 136/89 | HR 106 | Wt 194.5 lb

## 2021-11-14 DIAGNOSIS — Z113 Encounter for screening for infections with a predominantly sexual mode of transmission: Secondary | ICD-10-CM | POA: Insufficient documentation

## 2021-11-14 DIAGNOSIS — Z3A35 35 weeks gestation of pregnancy: Secondary | ICD-10-CM

## 2021-11-14 DIAGNOSIS — O24410 Gestational diabetes mellitus in pregnancy, diet controlled: Secondary | ICD-10-CM

## 2021-11-14 DIAGNOSIS — Z3685 Encounter for antenatal screening for Streptococcus B: Secondary | ICD-10-CM

## 2021-11-14 DIAGNOSIS — O0993 Supervision of high risk pregnancy, unspecified, third trimester: Secondary | ICD-10-CM

## 2021-11-14 LAB — POCT URINALYSIS DIPSTICK OB
Bilirubin, UA: NEGATIVE
Glucose, UA: NEGATIVE
Ketones, UA: POSITIVE
Nitrite, UA: NEGATIVE
Spec Grav, UA: 1.01 (ref 1.010–1.025)
Urobilinogen, UA: 0.2 E.U./dL
pH, UA: 6 (ref 5.0–8.0)

## 2021-11-14 NOTE — Addendum Note (Signed)
Addended by: Cornelius Moras D on: 11/14/2021 04:14 PM   Modules accepted: Orders

## 2021-11-14 NOTE — Progress Notes (Signed)
ROB: Feels very tired all the time. No new concerns.

## 2021-11-14 NOTE — Progress Notes (Signed)
ROB: Doing well, no major issues. Does noting feeling more fatigued. Blood sugars overall normal except last night at dinner, but patient reports eating rice and bread.  For [place] later today to check positioning as fetus has been malpresentation. 36 week cultures performed. Initial BP elevated but repeat wnl. RTC in 1 week. To begin NSTs weekly starting 36 weeks.

## 2021-11-16 LAB — STREP GP B NAA: Strep Gp B NAA: NEGATIVE

## 2021-11-19 LAB — CERVICOVAGINAL ANCILLARY ONLY
Chlamydia: NEGATIVE
Comment: NEGATIVE
Comment: NORMAL
Neisseria Gonorrhea: NEGATIVE

## 2021-11-23 ENCOUNTER — Other Ambulatory Visit: Payer: 59

## 2021-11-23 ENCOUNTER — Encounter: Payer: Self-pay | Admitting: Obstetrics

## 2021-11-23 ENCOUNTER — Ambulatory Visit (INDEPENDENT_AMBULATORY_CARE_PROVIDER_SITE_OTHER): Payer: 59 | Admitting: Obstetrics

## 2021-11-23 VITALS — BP 136/83 | HR 92 | Wt 196.4 lb

## 2021-11-23 DIAGNOSIS — Z3A37 37 weeks gestation of pregnancy: Secondary | ICD-10-CM

## 2021-11-23 LAB — POCT URINALYSIS DIPSTICK OB
Bilirubin, UA: NEGATIVE
Blood, UA: NEGATIVE
Glucose, UA: NEGATIVE
Ketones, UA: NEGATIVE
Leukocytes, UA: NEGATIVE
Nitrite, UA: NEGATIVE
POC,PROTEIN,UA: NEGATIVE
Spec Grav, UA: 1.02 (ref 1.010–1.025)
Urobilinogen, UA: 0.2 E.U./dL
pH, UA: 6.5 (ref 5.0–8.0)

## 2021-11-23 NOTE — Progress Notes (Signed)
ROB at [redacted]w[redacted]d. Active baby. Having occasional ctx. Denies vaginal bleeding and LOF. Reviewed BS log - all except 2 WNL! Has cut out cigarettes, vaping 1-2 times a day. Discussed when to go to the hospital. Labor prep handout given. RNST (see note). Desires SVE: closed/thick/soft. RTC in one week for ROB and NST.  Yvonne Reid Spanish, CNM

## 2021-11-27 ENCOUNTER — Encounter: Payer: Self-pay | Admitting: Obstetrics

## 2021-11-29 ENCOUNTER — Encounter: Payer: Self-pay | Admitting: Obstetrics and Gynecology

## 2021-11-29 ENCOUNTER — Other Ambulatory Visit: Payer: 59

## 2021-11-29 ENCOUNTER — Ambulatory Visit (INDEPENDENT_AMBULATORY_CARE_PROVIDER_SITE_OTHER): Payer: 59 | Admitting: Obstetrics and Gynecology

## 2021-11-29 VITALS — BP 147/95 | HR 85 | Wt 196.1 lb

## 2021-11-29 DIAGNOSIS — Z3A38 38 weeks gestation of pregnancy: Secondary | ICD-10-CM

## 2021-11-29 DIAGNOSIS — O24410 Gestational diabetes mellitus in pregnancy, diet controlled: Secondary | ICD-10-CM

## 2021-11-29 DIAGNOSIS — O0993 Supervision of high risk pregnancy, unspecified, third trimester: Secondary | ICD-10-CM | POA: Diagnosis not present

## 2021-11-29 LAB — POCT URINALYSIS DIPSTICK OB
Bilirubin, UA: NEGATIVE
Blood, UA: NEGATIVE
Glucose, UA: NEGATIVE
Ketones, UA: NEGATIVE
Leukocytes, UA: NEGATIVE
Nitrite, UA: NEGATIVE
POC,PROTEIN,UA: NEGATIVE
Spec Grav, UA: 1.01 (ref 1.010–1.025)
Urobilinogen, UA: 0.2 E.U./dL
pH, UA: 6 (ref 5.0–8.0)

## 2021-11-29 NOTE — Progress Notes (Signed)
ROB. Patient states daily fetal movement and occasional braxton hicks contractions. NST preformed today. Patient states no questions or concerns at this time.

## 2021-11-29 NOTE — Progress Notes (Signed)
ROB: No complaints.  Describes occasional Braxton Hicks contractions but not strong.  Reports daily fetal movement.  Diet-controlled gestational diabetes-excellent control based on sugar log.  NST reactive.

## 2021-11-30 ENCOUNTER — Encounter: Payer: Self-pay | Admitting: Obstetrics and Gynecology

## 2021-12-04 ENCOUNTER — Other Ambulatory Visit: Payer: 59

## 2021-12-04 ENCOUNTER — Ambulatory Visit (INDEPENDENT_AMBULATORY_CARE_PROVIDER_SITE_OTHER): Payer: 59 | Admitting: Certified Nurse Midwife

## 2021-12-04 ENCOUNTER — Encounter: Payer: Self-pay | Admitting: Obstetrics

## 2021-12-04 VITALS — BP 133/85 | HR 85 | Wt 196.0 lb

## 2021-12-04 DIAGNOSIS — Z3A38 38 weeks gestation of pregnancy: Secondary | ICD-10-CM

## 2021-12-04 LAB — POCT URINALYSIS DIPSTICK OB
Bilirubin, UA: NEGATIVE
Blood, UA: NEGATIVE
Glucose, UA: NEGATIVE
Ketones, UA: NEGATIVE
Leukocytes, UA: NEGATIVE
Nitrite, UA: NEGATIVE
POC,PROTEIN,UA: NEGATIVE
Spec Grav, UA: 1.02 (ref 1.010–1.025)
Urobilinogen, UA: 0.2 E.U./dL
pH, UA: 6.5 (ref 5.0–8.0)

## 2021-12-04 NOTE — Patient Instructions (Signed)
Braxton Hicks Contractions  Contractions of the uterus can occur throughout pregnancy, but they are not always a sign that you are in labor. You may have practice contractions called Braxton Hicks contractions. These false labor contractions are sometimes confused with true labor. What are Braxton Hicks contractions? Braxton Hicks contractions are tightening movements that occur in the muscles of the uterus before labor. Unlike true labor contractions, these contractions do not result in opening (dilation) and thinning of the lowest part of the uterus (cervix). Toward the end of pregnancy (32-34 weeks), Braxton Hicks contractions can happen more often and may become stronger. These contractions are sometimes difficult to tell apart from true labor because they can be very uncomfortable. How to tell the difference between true labor and false labor True labor Contractions last 30-70 seconds. Contractions become very regular. Discomfort is usually felt in the top of the uterus, and it spreads to the lower abdomen and low back. Contractions do not go away with walking. Contractions usually become stronger and more frequent. The cervix dilates and gets thinner. False labor Contractions are usually shorter, weaker, and farther apart than true labor contractions. Contractions are usually irregular. Contractions are often felt in the front of the lower abdomen and in the groin. Contractions may go away when you walk around or change positions while lying down. The cervix usually does not dilate or become thin. Sometimes, the only way to tell if you are in true labor is for your health care provider to look for changes in your cervix. Your health care provider will do a physical exam and may monitor your contractions. If you are in true labor, your health care provider will send you home with instructions about when to return to the hospital. You may continue to have Braxton Hicks contractions until you  go into true labor. Follow these instructions at home:  Take over-the-counter and prescription medicines only as told by your health care provider. If Braxton Hicks contractions are making you uncomfortable: Change your position from lying down or resting to walking, or change from walking to resting. Sit and rest in a tub of warm water. Drink enough fluid to keep your urine pale yellow. Dehydration may cause these contractions. Do slow and deep breathing several times an hour. Keep all follow-up visits. This is important. Contact a health care provider if: You have a fever. You have continuous pain in your abdomen. Your contractions become stronger, more regular, and closer together. You pass blood-tinged mucus. Get help right away if: You have fluid leaking or gushing from your vagina. You have bright red blood coming from your vagina. Your baby is not moving inside you as much as it used to. Summary You may have practice contractions called Braxton Hicks contractions. These false labor contractions are sometimes confused with true labor. Braxton Hicks contractions are usually shorter, weaker, farther apart, and less regular than true labor contractions. True labor contractions usually become stronger, more regular, and more frequent. Manage discomfort from Braxton Hicks contractions by changing position, resting in a warm bath, practicing deep breathing, and drinking plenty of water. Keep all follow-up visits. Contact your health care provider if your contractions become stronger, more regular, and closer together. This information is not intended to replace advice given to you by your health care provider. Make sure you discuss any questions you have with your health care provider. Document Revised: 01/03/2020 Document Reviewed: 01/03/2020 Elsevier Patient Education  2023 Elsevier Inc.  

## 2021-12-04 NOTE — Progress Notes (Signed)
Body mass index is 37.03 kg/m. ROB & NST gestational diabetes diet controlled. BS log reviewed with 2 out of range 2 hr pp. NST reactive . SVE per pt request 2/70/-2. Initial BP 138/98, rpt 133/85 . She denies headache, epigastric pain. She has noted swelling. Pre e labs collected today. Will follow up with results.   NST: category 1 Baseline 130 Accelerations present Decelerations absent Ctx; irritability .   Philip Aspen, CNM

## 2021-12-05 ENCOUNTER — Encounter: Payer: Self-pay | Admitting: Certified Nurse Midwife

## 2021-12-05 LAB — COMPREHENSIVE METABOLIC PANEL
ALT: 7 IU/L (ref 0–32)
AST: 15 IU/L (ref 0–40)
Albumin/Globulin Ratio: 1.5 (ref 1.2–2.2)
Albumin: 3.4 g/dL — ABNORMAL LOW (ref 3.9–4.9)
Alkaline Phosphatase: 301 IU/L — ABNORMAL HIGH (ref 44–121)
BUN/Creatinine Ratio: 9 (ref 9–23)
BUN: 5 mg/dL — ABNORMAL LOW (ref 6–20)
Bilirubin Total: <0.2 mg/dL (ref 0.0–1.2)
CO2: 19 mmol/L — ABNORMAL LOW (ref 20–29)
Calcium: 8.9 mg/dL (ref 8.7–10.2)
Chloride: 104 mmol/L (ref 96–106)
Creatinine, Ser: 0.57 mg/dL (ref 0.57–1.00)
Globulin, Total: 2.3 g/dL (ref 1.5–4.5)
Glucose: 71 mg/dL (ref 70–99)
Potassium: 4.4 mmol/L (ref 3.5–5.2)
Sodium: 137 mmol/L (ref 134–144)
Total Protein: 5.7 g/dL — ABNORMAL LOW (ref 6.0–8.5)
eGFR: 121 mL/min/{1.73_m2} (ref >59)

## 2021-12-05 LAB — CBC
Hematocrit: 36.7 % (ref 34.0–46.6)
Hemoglobin: 12 g/dL (ref 11.1–15.9)
MCH: 27.6 pg (ref 26.6–33.0)
MCHC: 32.7 g/dL (ref 31.5–35.7)
MCV: 84 fL (ref 79–97)
Platelets: 291 10*3/uL (ref 150–450)
RBC: 4.35 x10E6/uL (ref 3.77–5.28)
RDW: 12.6 % (ref 11.7–15.4)
WBC: 10 10*3/uL (ref 3.4–10.8)

## 2021-12-05 LAB — PROTEIN / CREATININE RATIO, URINE
Creatinine, Urine: 38.4 mg/dL
Protein, Ur: 9.9 mg/dL
Protein/Creat Ratio: 258 mg/g creat — ABNORMAL HIGH (ref 0–200)

## 2021-12-09 ENCOUNTER — Inpatient Hospital Stay: Payer: 59 | Admitting: General Practice

## 2021-12-09 ENCOUNTER — Encounter: Payer: Self-pay | Admitting: Obstetrics and Gynecology

## 2021-12-09 ENCOUNTER — Other Ambulatory Visit: Payer: Self-pay

## 2021-12-09 ENCOUNTER — Inpatient Hospital Stay
Admission: EM | Admit: 2021-12-09 | Discharge: 2021-12-10 | DRG: 806 | Disposition: A | Discharge status: Home / Self Care | Payer: 59 | Attending: Licensed Practical Nurse | Admitting: Licensed Practical Nurse

## 2021-12-09 DIAGNOSIS — O479 False labor, unspecified: Secondary | ICD-10-CM | POA: Diagnosis present

## 2021-12-09 DIAGNOSIS — O1494 Unspecified pre-eclampsia, complicating childbirth: Secondary | ICD-10-CM | POA: Diagnosis not present

## 2021-12-09 DIAGNOSIS — O24420 Gestational diabetes mellitus in childbirth, diet controlled: Principal | ICD-10-CM | POA: Diagnosis present

## 2021-12-09 DIAGNOSIS — O99513 Diseases of the respiratory system complicating pregnancy, third trimester: Secondary | ICD-10-CM | POA: Diagnosis not present

## 2021-12-09 DIAGNOSIS — O34219 Maternal care for unspecified type scar from previous cesarean delivery: Secondary | ICD-10-CM | POA: Diagnosis present

## 2021-12-09 DIAGNOSIS — O24410 Gestational diabetes mellitus in pregnancy, diet controlled: Secondary | ICD-10-CM | POA: Diagnosis present

## 2021-12-09 DIAGNOSIS — O09523 Supervision of elderly multigravida, third trimester: Secondary | ICD-10-CM | POA: Diagnosis not present

## 2021-12-09 DIAGNOSIS — O872 Hemorrhoids in the puerperium: Secondary | ICD-10-CM | POA: Diagnosis not present

## 2021-12-09 DIAGNOSIS — Z98891 History of uterine scar from previous surgery: Secondary | ICD-10-CM

## 2021-12-09 DIAGNOSIS — Z3A39 39 weeks gestation of pregnancy: Secondary | ICD-10-CM | POA: Diagnosis not present

## 2021-12-09 DIAGNOSIS — O9952 Diseases of the respiratory system complicating childbirth: Secondary | ICD-10-CM | POA: Diagnosis present

## 2021-12-09 DIAGNOSIS — Z87891 Personal history of nicotine dependence: Secondary | ICD-10-CM

## 2021-12-09 DIAGNOSIS — O139 Gestational [pregnancy-induced] hypertension without significant proteinuria, unspecified trimester: Secondary | ICD-10-CM

## 2021-12-09 DIAGNOSIS — Z833 Family history of diabetes mellitus: Secondary | ICD-10-CM

## 2021-12-09 DIAGNOSIS — J45909 Unspecified asthma, uncomplicated: Secondary | ICD-10-CM | POA: Diagnosis present

## 2021-12-09 DIAGNOSIS — O26893 Other specified pregnancy related conditions, third trimester: Secondary | ICD-10-CM | POA: Diagnosis present

## 2021-12-09 DIAGNOSIS — O134 Gestational [pregnancy-induced] hypertension without significant proteinuria, complicating childbirth: Secondary | ICD-10-CM | POA: Diagnosis present

## 2021-12-09 LAB — COMPREHENSIVE METABOLIC PANEL
ALT: 7 U/L (ref 0–44)
AST: 18 U/L (ref 15–41)
Albumin: 3.3 g/dL — ABNORMAL LOW (ref 3.5–5.0)
Alkaline Phosphatase: 339 U/L — ABNORMAL HIGH (ref 38–126)
Anion gap: 7 (ref 5–15)
BUN: 14 mg/dL (ref 6–20)
CO2: 18 mmol/L — ABNORMAL LOW (ref 22–32)
Calcium: 9.1 mg/dL (ref 8.9–10.3)
Chloride: 111 mmol/L (ref 98–111)
Creatinine, Ser: 0.63 mg/dL (ref 0.44–1.00)
GFR, Estimated: >60 mL/min (ref >60)
Glucose, Bld: 100 mg/dL — ABNORMAL HIGH (ref 70–99)
Potassium: 3.9 mmol/L (ref 3.5–5.1)
Sodium: 136 mmol/L (ref 135–145)
Total Bilirubin: 0.3 mg/dL (ref 0.3–1.2)
Total Protein: 6.9 g/dL (ref 6.5–8.1)

## 2021-12-09 LAB — CBC WITH DIFFERENTIAL/PLATELET
Abs Immature Granulocytes: 0.06 10*3/uL (ref 0.00–0.07)
Basophils Absolute: 0.1 10*3/uL (ref 0.0–0.1)
Basophils Relative: 1 %
Eosinophils Absolute: 0.3 10*3/uL (ref 0.0–0.5)
Eosinophils Relative: 3 %
HCT: 40.2 % (ref 36.0–46.0)
Hemoglobin: 13.1 g/dL (ref 12.0–15.0)
Immature Granulocytes: 1 %
Lymphocytes Relative: 29 %
Lymphs Abs: 3.7 10*3/uL (ref 0.7–4.0)
MCH: 27.6 pg (ref 26.0–34.0)
MCHC: 32.6 g/dL (ref 30.0–36.0)
MCV: 84.8 fL (ref 80.0–100.0)
Monocytes Absolute: 0.8 10*3/uL (ref 0.1–1.0)
Monocytes Relative: 6 %
Neutro Abs: 7.9 10*3/uL — ABNORMAL HIGH (ref 1.7–7.7)
Neutrophils Relative %: 60 %
Platelets: 317 10*3/uL (ref 150–400)
RBC: 4.74 MIL/uL (ref 3.87–5.11)
RDW: 13 % (ref 11.5–15.5)
WBC: 12.9 10*3/uL — ABNORMAL HIGH (ref 4.0–10.5)
nRBC: 0 % (ref 0.0–0.2)

## 2021-12-09 LAB — TYPE AND SCREEN
ABO/RH(D): B POS
Antibody Screen: NEGATIVE

## 2021-12-09 LAB — PROTEIN / CREATININE RATIO, URINE
Creatinine, Urine: 81 mg/dL
Protein Creatinine Ratio: 0.3 mg/mg{Cre} — ABNORMAL HIGH (ref 0.00–0.15)
Total Protein, Urine: 24 mg/dL

## 2021-12-09 MED ORDER — WITCH HAZEL-GLYCERIN EX PADS
1.0000 | MEDICATED_PAD | CUTANEOUS | Status: DC | PRN
  Filled 2021-12-09: qty 100

## 2021-12-09 MED ORDER — MISOPROSTOL 200 MCG PO TABS
ORAL_TABLET | ORAL | Status: AC
  Filled 2021-12-09: qty 4

## 2021-12-09 MED ORDER — LABETALOL HCL 5 MG/ML IV SOLN: 80.0000 mg | INTRAVENOUS | Status: DC | PRN

## 2021-12-09 MED ORDER — IBUPROFEN 600 MG PO TABS
600.0000 mg | ORAL_TABLET | Freq: Four times a day (QID) | ORAL | Status: DC
  Administered 2021-12-09 – 2021-12-10 (×5): 600 mg via ORAL
  Filled 2021-12-09 (×5): qty 1

## 2021-12-09 MED ORDER — PHENYLEPHRINE 80 MCG/ML (10ML) SYRINGE FOR IV PUSH (FOR BLOOD PRESSURE SUPPORT): 80.0000 ug | PREFILLED_SYRINGE | INTRAVENOUS | Status: DC | PRN

## 2021-12-09 MED ORDER — COCONUT OIL OIL: 1.0000 | TOPICAL_OIL | Status: DC | PRN

## 2021-12-09 MED ORDER — HYDRALAZINE HCL 20 MG/ML IJ SOLN: 10.0000 mg | INTRAMUSCULAR | Status: DC | PRN

## 2021-12-09 MED ORDER — DIPHENHYDRAMINE HCL 50 MG/ML IJ SOLN: 12.5000 mg | INTRAMUSCULAR | Status: DC | PRN

## 2021-12-09 MED ORDER — LACTATED RINGERS AMNIOINFUSION: INTRAVENOUS | Status: DC

## 2021-12-09 MED ORDER — LACTATED RINGERS IV SOLN: INTRAVENOUS | Status: DC

## 2021-12-09 MED ORDER — WITCH HAZEL-GLYCERIN EX PADS
MEDICATED_PAD | CUTANEOUS | Status: AC
  Filled 2021-12-09: qty 100

## 2021-12-09 MED ORDER — ZOLPIDEM TARTRATE 5 MG PO TABS: 5.0000 mg | ORAL_TABLET | Freq: Every evening | ORAL | Status: DC | PRN

## 2021-12-09 MED ORDER — LIDOCAINE HCL (PF) 1 % IJ SOLN: 30.0000 mL | INTRAMUSCULAR | Status: AC | PRN

## 2021-12-09 MED ORDER — LABETALOL HCL 5 MG/ML IV SOLN: 40.0000 mg | INTRAVENOUS | Status: DC | PRN

## 2021-12-09 MED ORDER — LACTATED RINGERS IV SOLN
500.0000 mL | INTRAVENOUS | Status: DC | PRN
  Administered 2021-12-09: 500 mL via INTRAVENOUS

## 2021-12-09 MED ORDER — DIPHENHYDRAMINE HCL 25 MG PO CAPS: 25.0000 mg | ORAL_CAPSULE | Freq: Four times a day (QID) | ORAL | Status: DC | PRN

## 2021-12-09 MED ORDER — BENZOCAINE-MENTHOL 20-0.5 % EX AERO
1.0000 | INHALATION_SPRAY | CUTANEOUS | Status: DC | PRN
  Filled 2021-12-09: qty 56

## 2021-12-09 MED ORDER — DIBUCAINE (PERIANAL) 1 % EX OINT
TOPICAL_OINTMENT | CUTANEOUS | Status: AC
  Filled 2021-12-09: qty 28

## 2021-12-09 MED ORDER — LIDOCAINE HCL (PF) 1 % IJ SOLN
INTRAMUSCULAR | Status: AC
  Administered 2021-12-09: 30 mL via SUBCUTANEOUS
  Filled 2021-12-09: qty 30

## 2021-12-09 MED ORDER — EPHEDRINE 5 MG/ML INJ: 10.0000 mg | INTRAVENOUS | Status: DC | PRN

## 2021-12-09 MED ORDER — SOD CITRATE-CITRIC ACID 500-334 MG/5ML PO SOLN: 30.0000 mL | ORAL | Status: DC | PRN

## 2021-12-09 MED ORDER — PRENATAL MULTIVITAMIN CH
1.0000 | ORAL_TABLET | Freq: Every day | ORAL | Status: DC
  Administered 2021-12-09 – 2021-12-10 (×2): 1 via ORAL
  Filled 2021-12-09 (×2): qty 1

## 2021-12-09 MED ORDER — BENZOCAINE-MENTHOL 20-0.5 % EX AERO
INHALATION_SPRAY | CUTANEOUS | Status: AC
  Filled 2021-12-09: qty 56

## 2021-12-09 MED ORDER — DOCUSATE SODIUM 100 MG PO CAPS
100.0000 mg | ORAL_CAPSULE | Freq: Two times a day (BID) | ORAL | Status: DC
  Administered 2021-12-09 – 2021-12-10 (×2): 100 mg via ORAL
  Filled 2021-12-09 (×2): qty 1

## 2021-12-09 MED ORDER — LABETALOL HCL 5 MG/ML IV SOLN: 20.0000 mg | INTRAVENOUS | Status: DC | PRN

## 2021-12-09 MED ORDER — OXYTOCIN-SODIUM CHLORIDE 30-0.9 UT/500ML-% IV SOLN: 2.5000 [IU]/h | INTRAVENOUS | Status: DC

## 2021-12-09 MED ORDER — AMMONIA AROMATIC IN INHA
RESPIRATORY_TRACT | Status: AC
  Filled 2021-12-09: qty 10

## 2021-12-09 MED ORDER — EPHEDRINE 5 MG/ML INJ
10.0000 mg | INTRAVENOUS | Status: DC | PRN
  Administered 2021-12-09: 10 mg via INTRAVENOUS
  Filled 2021-12-09: qty 5

## 2021-12-09 MED ORDER — FENTANYL CITRATE (PF) 100 MCG/2ML IJ SOLN
50.0000 ug | INTRAMUSCULAR | Status: DC | PRN
  Administered 2021-12-09: 100 ug via INTRAVENOUS
  Filled 2021-12-09: qty 2

## 2021-12-09 MED ORDER — OXYTOCIN BOLUS FROM INFUSION: 333.0000 mL | Freq: Once | INTRAVENOUS | Status: AC

## 2021-12-09 MED ORDER — SODIUM CHLORIDE 0.9 % IV SOLN
INTRAVENOUS | Status: DC | PRN
  Administered 2021-12-09 (×2): 5 mL via EPIDURAL

## 2021-12-09 MED ORDER — SIMETHICONE 80 MG PO CHEW: 80.0000 mg | CHEWABLE_TABLET | ORAL | Status: DC | PRN

## 2021-12-09 MED ORDER — FENTANYL-BUPIVACAINE-NACL 0.5-0.125-0.9 MG/250ML-% EP SOLN
12.0000 mL/h | EPIDURAL | Status: DC | PRN
  Administered 2021-12-09: 12 mL/h via EPIDURAL
  Filled 2021-12-09: qty 250

## 2021-12-09 MED ORDER — OXYTOCIN-SODIUM CHLORIDE 30-0.9 UT/500ML-% IV SOLN
INTRAVENOUS | Status: AC
  Administered 2021-12-09: 333 mL via INTRAVENOUS
  Filled 2021-12-09: qty 500

## 2021-12-09 MED ORDER — ONDANSETRON HCL 4 MG/2ML IJ SOLN: 4.0000 mg | INTRAMUSCULAR | Status: DC | PRN

## 2021-12-09 MED ORDER — ONDANSETRON HCL 4 MG/2ML IJ SOLN
4.0000 mg | Freq: Four times a day (QID) | INTRAMUSCULAR | Status: DC | PRN
  Administered 2021-12-09: 4 mg via INTRAVENOUS
  Filled 2021-12-09: qty 2

## 2021-12-09 MED ORDER — ACETAMINOPHEN 500 MG PO TABS
1000.0000 mg | ORAL_TABLET | Freq: Four times a day (QID) | ORAL | Status: DC
  Administered 2021-12-09 – 2021-12-10 (×5): 1000 mg via ORAL
  Filled 2021-12-09 (×5): qty 2

## 2021-12-09 MED ORDER — ONDANSETRON HCL 4 MG PO TABS: 4.0000 mg | ORAL_TABLET | ORAL | Status: DC | PRN

## 2021-12-09 MED ORDER — LACTATED RINGERS IV SOLN
500.0000 mL | Freq: Once | INTRAVENOUS | Status: AC
  Administered 2021-12-09: 500 mL via INTRAVENOUS

## 2021-12-09 MED ORDER — DIBUCAINE (PERIANAL) 1 % EX OINT
1.0000 | TOPICAL_OINTMENT | CUTANEOUS | Status: DC | PRN
  Filled 2021-12-09: qty 28

## 2021-12-09 MED ORDER — OXYTOCIN 10 UNIT/ML IJ SOLN
INTRAMUSCULAR | Status: AC
  Filled 2021-12-09: qty 2

## 2021-12-09 MED ORDER — TERBUTALINE SULFATE 1 MG/ML IJ SOLN
INTRAMUSCULAR | Status: AC
  Filled 2021-12-09: qty 1

## 2021-12-09 NOTE — Discharge Summary (Shared)
Obstetrical Discharge Summary  Date of Admission: 12/09/2021 Date of Discharge: 12/10/2021  Primary OB: [facility]   Gestational Age at Delivery: [redacted]w[redacted]d   Antepartum complications: gestational diabetes and elevated blood pressure  Reason for Admission: Labor  Date of Delivery: 12/09/2021  Delivered By: Siri Cole, CNM  Delivery Type: vaginal birth after cesarean (VBAC) Intrapartum complications/course: Non-reassuring Fetal Status and preeclampsia  Anesthesia: local and epidural Placenta: Delivered and expressed via active management. Intact: yes. Mec stained  To pathology: no.  Laceration: vaginal and labial Episiotomy: none EBL: 100 Baby: Liveborn female, APGARs 7/9, weight 5lbs 3oz .    Discharge Diagnosis: Delivered. Gestational hypertension, GDMA1  Postpartum course: patient had a routine postpartum course; she is tolerating regular diet, her pain is controlled with PO medication, she is ambulating and voiding without difficulty. She is breastfeeding and formula feeding/ass[facility] from [facility]. Preeclampsia precautions/warning signs reviewed.  Discharge Vital Signs:  Current Vital Signs 24h Vital Sign Ranges  T 97.9 F (36.6 C) Temp  Avg: 97.9 F (36.6 C)  Min: 97.7 F (36.5 C)  Max: 98.2 F (36.8 C)  BP 116/78 BP  Min: 116/78  Max: 131/81  HR 80 Pulse  Avg: 81.8  Min: 79  Max: 87  RR 16 Resp  Avg: 17.4  Min: 16  Max: 19  SaO2 98 %   SpO2  Avg: 97.6 %  Min: 97 %  Max: 98 %       24 Hour I/O Current Shift I/O  Time Ins Outs No intake/output data recorded. No intake/output data recorded.     Patient Vitals for the past 6 hrs:  BP Temp Temp src Pulse Resp SpO2  12/10/21 1150 116/78 97.9 F (36.6 C) Oral 80 16 98 %    Patient Vitals for the past 24 hrs:  BP Temp Temp src Pulse Resp SpO2  12/10/21 1150 116/78 97.9 F (36.6 C) Oral 80 16 98 %  12/10/21 0800 (!) 125/90 97.7 F (36.5 C) Oral 81 16 98 %  12/10/21 0415 125/86 97.8 F (36.6 C) Oral 82 17 98 %  12/09/21 2358  131/81 97.7 F (36.5 C) Oral 79 19 97 %  12/09/21 1945 131/83 98.2 F (36.8 C) Oral 87 19 97 %     Discharge Exam:  NAD Perineum: 1st degree and labial repaired Abdomen: firm fundus below the umbilicus, NTTP, non distended, +bowel sounds.   RRR no MRGs CTAB Ext: no c/c/e  Recent Labs  Lab 12/04/21 1142 12/09/21 0042 12/10/21 0628  WBC 10.0 12.9* 14.3*  HGB 12.0 13.1 9.8*  HCT 36.7 40.2 30.4*  PLT 291 317 236    Disposition: Home  Rh Immune globulin given: no Rubella vaccine given: no Tdap vaccine given in AP or PP setting: yes Flu vaccine given in AP or PP setting: no  Contraception: interval bilateral tubal ligation  Prenatal/Postnatal Panel: B POS//Rubella Immune//Varicella Immune//RPR negative//HIV negative/HepB Surface Ag negative//pap  NILM, HPV pos   (date: 06/06/2021)//plans to breastfeed  Plan:  York Ram was discharged to home in good condition. Follow-up appointment with MD at 2 weeks (BP check and tubal planning visit) and with LMD at 6  weeks for a PP visit  No future appointments.   Discharge Medications: Allergies as of 12/10/2021       Reactions   Sulfa Antibiotics Other (See Comments)        Medication List     STOP taking these medications    Accu-Chek Guide test strip Generic drug: glucose  blood   Accu-Chek Softclix Lancets lancets   aspirin EC 81 MG tablet   ondansetron 4 MG disintegrating tablet Commonly known as: ZOFRAN-ODT   promethazine 25 MG tablet Commonly known as: PHENERGAN       TAKE these medications    albuterol 108 (90 Base) MCG/ACT inhaler Commonly known as: VENTOLIN HFA Inhale 1-2 puffs into the lungs every 6 (six) hours as needed for wheezing or shortness of breath.   ferrous sulfate 325 (65 FE) MG tablet Take 1 tablet (325 mg total) by mouth daily with breakfast.   multivitamin-prenatal 27-0.8 MG Tabs tablet Take 1 tablet by mouth daily at 12 noon.        Christean Leaf, CNM [facility] 12/10/2021, 10:29 AM

## 2021-12-09 NOTE — Progress Notes (Signed)
Subjective:  Comfortable with epidural.  Family at her bedside. Agreeable to AROM, FSE, and IUPC.  Objective:   Vitals: Blood pressure 120/63, pulse 97, temperature 98.2 F (36.8 C), last menstrual period 03/08/2021, SpO2 96 %. General: NAD Abdomen:non tender  Cervical Exam:  Dilation: 7 Effacement (%): 80 Station: 0 Presentation: Vertex Exam by:: L. Khadar Monger, CNM  FSE: baseline 155, minimal to moderate variability, neg accel, variables present IUPC: q 1.5-3  Results for orders placed or performed during the hospital encounter of 12/09/21 (from the past 24 hour(s))  Comprehensive metabolic panel     Status: Abnormal   Collection Time: 12/09/21 12:42 AM  Result Value Ref Range   Sodium 136 135 - 145 mmol/L   Potassium 3.9 3.5 - 5.1 mmol/L   Chloride 111 98 - 111 mmol/L   CO2 18 (L) 22 - 32 mmol/L   Glucose, Bld 100 (H) 70 - 99 mg/dL   BUN 14 6 - 20 mg/dL   Creatinine, Ser 0.63 0.44 - 1.00 mg/dL   Calcium 9.1 8.9 - 10.3 mg/dL   Total Protein 6.9 6.5 - 8.1 g/dL   Albumin 3.3 (L) 3.5 - 5.0 g/dL   AST 18 15 - 41 U/L   ALT 7 0 - 44 U/L   Alkaline Phosphatase 339 (H) 38 - 126 U/L   Total Bilirubin 0.3 0.3 - 1.2 mg/dL   GFR, Estimated >60 >60 mL/min   Anion gap 7 5 - 15  CBC with Differential/Platelet     Status: Abnormal   Collection Time: 12/09/21 12:42 AM  Result Value Ref Range   WBC 12.9 (H) 4.0 - 10.5 K/uL   RBC 4.74 3.87 - 5.11 MIL/uL   Hemoglobin 13.1 12.0 - 15.0 g/dL   HCT 40.2 36.0 - 46.0 %   MCV 84.8 80.0 - 100.0 fL   MCH 27.6 26.0 - 34.0 pg   MCHC 32.6 30.0 - 36.0 g/dL   RDW 13.0 11.5 - 15.5 %   Platelets 317 150 - 400 K/uL   nRBC 0.0 0.0 - 0.2 %   Neutrophils Relative % 60 %   Neutro Abs 7.9 (H) 1.7 - 7.7 K/uL   Lymphocytes Relative 29 %   Lymphs Abs 3.7 0.7 - 4.0 K/uL   Monocytes Relative 6 %   Monocytes Absolute 0.8 0.1 - 1.0 K/uL   Eosinophils Relative 3 %   Eosinophils Absolute 0.3 0.0 - 0.5 K/uL   Basophils Relative 1 %   Basophils Absolute  0.1 0.0 - 0.1 K/uL   Immature Granulocytes 1 %   Abs Immature Granulocytes 0.06 0.00 - 0.07 K/uL  Protein / creatinine ratio, urine     Status: Abnormal   Collection Time: 12/09/21 12:46 AM  Result Value Ref Range   Creatinine, Urine 81 mg/dL   Total Protein, Urine 24 mg/dL   Protein Creatinine Ratio 0.30 (H) 0.00 - 0.15 mg/mg[Cre]    Assessment:   36 y.o. [id number] [redacted]w[redacted]d admitted in labor  Plan:   1) Labor -TOLAC in active labor, FSE and IUPC placed   2) Fetus - Category 2 tracing, amnioinfusion started  3) ID: GBS negative, AROM for scant blood tinged fluid at 0312  4) Preeclampsia: UPC .30, BP WNL to mild elevation, severe range noted during epidural placement and after ephedrine administration.   4) Pain Management: epidural managed by Anesthesia   Dr Amalia Hailey called, updated on pt's status/plan and strip reviewed.    Roberto Scales, CNM  [facility]  @TODAY @  3:42 AM '

## 2021-12-09 NOTE — Anesthesia Procedure Notes (Signed)
Epidural Patient location during procedure: OB Start time: 12/09/2021 1:47 AM End time: 12/09/2021 1:58 AM  Staffing Anesthesiologist: Dimas Millin, MD Performed: anesthesiologist   Preanesthetic Checklist Completed: patient identified, IV checked, site marked, risks and benefits discussed, surgical consent, monitors and equipment checked, pre-op evaluation and timeout performed  Epidural Patient position: sitting Prep: Betadine Patient monitoring: heart rate, continuous pulse ox and blood pressure Approach: midline Location: L4-L5 Injection technique: LOR saline  Needle:  Needle type: Tuohy  Needle gauge: 18 G Needle length: 9 cm and 9 Needle insertion depth: 6 cm Catheter type: closed end flexible Catheter size: 20 Guage Catheter at skin depth: 11 cm Test dose: negative and 1.5% lidocaine with Epi 1:200 K  Assessment Events: blood not aspirated, injection not painful, no injection resistance, no paresthesia and negative IV test  Additional Notes   Patient tolerated the insertion well without complications.Reason for block:procedure for pain

## 2021-12-09 NOTE — OB Triage Note (Signed)
Pt arrived to unit with complaints of contractions that staterted around 10pm last night. Pt is G3P1. Pt reports no symptoms consistent with ROM or vaginal bleeding. Pt placed on EFM and toco to non tender area of abdomen. Pt history reviewed and consents signed. Pt assessed and clonus present and hyper reflexes. Pt noted to have 3+ pitting edema in BLe. Willnotify provider on call of patient arrival.

## 2021-12-09 NOTE — H&P (Signed)
OB History & Physical   History of Present Illness:  Chief Complaint:   HPI:  ORENE ABBASI is a 36 y.o. [id number] female at [redacted]w[redacted]d dated by 6wkUS.  She presents to L&D for contractions.  She has been feeling contraction on and off for the last 3 or 4 days but they picked up around 2200 tonight. Reports intense pain along the lower abdomen.  Endorses+ FM, denies LOF/VB. Denies HA, visual disturbances or RUQ.  Requesting pain medication.  Aaylah has had early and regular prenatal care. See list below for pregnancy complications.     Pregnancy Issues: 1. AMA 2. Previous c/s 14 years ago 3. GHTN 4. GDM diet controlled 5. Hx Asthma   Maternal Medical History:   Past Medical History:  Diagnosis Date   Asthma    Asthma    Gestational diabetes    Headache    Migraines    Seizures (HCC)    PSEUDOSEIZURES   Seizures (HCC)    psuedo    Past Surgical History:  Procedure Laterality Date   CESAREAN SECTION  2009   I & D EXTREMITY Right 08/24/2015   Procedure: IRRIGATION AND DEBRIDEMENT EXTREMITY;  Surgeon: Kieth Brightly, MD;  Location: [facility] ORS;  Service: General;  Laterality: Right;    Allergies  Allergen Reactions   Sulfa Antibiotics Other (See Comments)    Prior to Admission medications   Medication Sig Start Date End Date Taking? Authorizing Provider  ACCU-CHEK GUIDE test strip AS DIRECTED FOUR TIMES DAILY 10/03/21   [provider]  Accu-Chek Softclix Lancets lancets 4 (four) times daily. 10/03/21   [provider]  albuterol (VENTOLIN HFA) 108 (90 Base) MCG/ACT inhaler Inhale 1-2 puffs into the lungs every 6 (six) hours as needed for wheezing or shortness of breath. 03/21/20   Delton See, MD  aspirin EC 81 MG tablet Take 162 mg by mouth daily. Swallow whole.    [provider]  ondansetron (ZOFRAN-ODT) 4 MG disintegrating tablet Take 1 tablet (4 mg total) by mouth every 8 (eight) hours as needed for nausea or vomiting. 06/20/21    Willy Eddy, MD  Prenatal Vit-Fe Fumarate-FA (MULTIVITAMIN-PRENATAL) 27-0.8 MG TABS tablet Take 1 tablet by mouth daily at 12 noon.    [provider]  promethazine (PHENERGAN) 25 MG tablet Take 25 mg by mouth every 6 (six) hours as needed for nausea or vomiting.    [provider]     Prenatal care site: [facility]   Social History: She  reports that she quit smoking about 7 months ago. Her smoking use included cigarettes. She has a 3.50 pack-year smoking history. She has never used smokeless tobacco. She reports that she does not currently use alcohol. She reports that she does not use drugs.  Family History: family history includes Breast cancer in her maternal aunt; Cervical cancer in her sister; Diabetes in her maternal grandfather and maternal grandmother; Heart block in her father.   Review of Systems: A full review of systems was performed and negative except as noted in the HPI.     Physical Exam:  Vital Signs: BP (!) 157/97   Temp 98.2 F (36.8 C) (Oral)   LMP 03/08/2021  General: no acute distress.  HEENT: normocephalic, atraumatic Heart: regular rate & rhythm.  No murmurs/rubs/gallops Lungs: clear to auscultation bilaterally, normal respiratory effort Abdomen: soft, gravid, non-tender;  EFW: 6lbs Pelvic:   External: Normal external female genitalia  Cervix: Dilation: 4.5 / Effacement (%): 70 / Station: 0  Extremities: non-tender, symmetric, +2 edema bilaterally.  DTRs: +2  Neurologic: Alert & oriented x 3.    Results for orders placed or performed during the hospital encounter of 12/09/21 (from the past 24 hour(s))  Comprehensive metabolic panel     Status: Abnormal   Collection Time: 12/09/21 12:42 AM  Result Value Ref Range   Sodium 136 135 - 145 mmol/L   Potassium 3.9 3.5 - 5.1 mmol/L   Chloride 111 98 - 111 mmol/L   CO2 18 (L) 22 - 32 mmol/L   Glucose, Bld 100 (H) 70 - 99 mg/dL   BUN 14 6 - 20 mg/dL   Creatinine, Ser 0.63 0.44 - 1.00  mg/dL   Calcium 9.1 8.9 - 10.3 mg/dL   Total Protein 6.9 6.5 - 8.1 g/dL   Albumin 3.3 (L) 3.5 - 5.0 g/dL   AST 18 15 - 41 U/L   ALT 7 0 - 44 U/L   Alkaline Phosphatase 339 (H) 38 - 126 U/L   Total Bilirubin 0.3 0.3 - 1.2 mg/dL   GFR, Estimated >60 >60 mL/min   Anion gap 7 5 - 15  CBC with Differential/Platelet     Status: Abnormal   Collection Time: 12/09/21 12:42 AM  Result Value Ref Range   WBC 12.9 (H) 4.0 - 10.5 K/uL   RBC 4.74 3.87 - 5.11 MIL/uL   Hemoglobin 13.1 12.0 - 15.0 g/dL   HCT 40.2 36.0 - 46.0 %   MCV 84.8 80.0 - 100.0 fL   MCH 27.6 26.0 - 34.0 pg   MCHC 32.6 30.0 - 36.0 g/dL   RDW 13.0 11.5 - 15.5 %   Platelets 317 150 - 400 K/uL   nRBC 0.0 0.0 - 0.2 %   Neutrophils Relative % 60 %   Neutro Abs 7.9 (H) 1.7 - 7.7 K/uL   Lymphocytes Relative 29 %   Lymphs Abs 3.7 0.7 - 4.0 K/uL   Monocytes Relative 6 %   Monocytes Absolute 0.8 0.1 - 1.0 K/uL   Eosinophils Relative 3 %   Eosinophils Absolute 0.3 0.0 - 0.5 K/uL   Basophils Relative 1 %   Basophils Absolute 0.1 0.0 - 0.1 K/uL   Immature Granulocytes 1 %   Abs Immature Granulocytes 0.06 0.00 - 0.07 K/uL    Pertinent Results:  Prenatal Labs: Blood type/Rh B +  Antibody screen neg  Rubella Immune  Varicella Immune  RPR NR  HBsAg Neg  HIV NR  GC neg  Chlamydia neg  Genetic screening negative  1 hour GTT 160  3 hour GTT 72, 194, 175, 76  GBS Negative    MOQ:HUTMLYYT 155, moderate variability, neg accel, neg decel  TOCO:q 2-3, moderate, soft resting tone.  SVE:  Dilation: 4.5 / Effacement (%): 70 / Station: 0    Cephalic by leopolds  US OB Follow Up  Result Date: 11/15/2021 Patient Name: NANCIE BOCANEGRA DOB: Jul 27, 1985 MRN: [id number] ULTRASOUND REPORT Location: [facility] Date of Service: 11/14/2021 Indications:growth/afi for GDM Findings: Nelda Marseille intrauterine pregnancy is visualized with FHR at 144 BPM. Biometrics gives an (U/S) Gestational age of [redacted]w[redacted]d and an (U/S) EDD of 12/16/2021;  this correlates with the clinically established Estimated Date of Delivery: 12/13/21. Fetal presentation is Cephalic. Placenta: posterior. AFI: 18.2 cm Growth percentile is 42nd.  AC percentile is 82nd. EFW: 2719g  (6lbs 0oz) Impression: 1. [redacted]w[redacted]d Viable Single Intrauterine pregnancy dated by previously established criteria. 2. Growth is 42nd %ile.  AFI is 18.2 cm. Recommendations: 1.Clinical correlation  with the patient's History and Physical Exam. Willette Alma, RDMS, RVT I have reviewed this study and agree with documented findings. Hildred Laser, MD [facility]   Assessment:  MARIANGEL RINGLEY is a 36 y.o. [phone] female at [redacted]w[redacted]d with labor.   Plan:  Admit to Labor & Delivery, DR Logan Bores aware of admission and plan  HX c/s, TOLAC consent signed GHTN: labs pending  CBC, T&S, Clrs, IVF GBS  negative, membranes intact  Consents obtained. Continuous efm/toco Pain management: pt requesting epidural   ----- Carie Caddy, CNM  [facility]

## 2021-12-09 NOTE — Anesthesia Preprocedure Evaluation (Signed)
Anesthesia Evaluation  Patient identified by MRN, date of birth, ID band Patient awake    Reviewed: Allergy & Precautions, NPO status , Patient's Chart, lab work & pertinent test results  Airway Mallampati: III  TM Distance: >3 FB Neck ROM: full    Dental  (+) Dental Advidsory Given   Pulmonary asthma , former smoker,    Pulmonary exam normal        Cardiovascular Exercise Tolerance: Good negative cardio ROS Normal cardiovascular exam     Neuro/Psych Seizures -,     GI/Hepatic negative GI ROS,   Endo/Other  diabetes  Renal/GU   negative genitourinary   Musculoskeletal   Abdominal   Peds  Hematology negative hematology ROS (+)   Anesthesia Other Findings Patient has history of c section 14 years ago. Patient was intubated for emergent c section and was told she was a difficult airway.   Past Medical History: No date: Asthma No date: Asthma No date: Gestational diabetes No date: Headache No date: Migraines No date: Seizures ([place])     Comment:  PSEUDOSEIZURES No date: Seizures ([place])     Comment:  psuedo  Past Surgical History: 2009: CESAREAN SECTION 08/24/2015: I & D EXTREMITY; Right     Comment:  Procedure: IRRIGATION AND DEBRIDEMENT EXTREMITY;                Surgeon: Christene Lye, MD;  Location: [facility] ORS;               Service: General;  Laterality: Right;     Reproductive/Obstetrics (+) Pregnancy                             Anesthesia Physical Anesthesia Plan  ASA: 2  Anesthesia Plan: Epidural   Post-op Pain Management:    Induction:   PONV Risk Score and Plan:   Airway Management Planned: Natural Airway  Additional Equipment:   Intra-op Plan:   Post-operative Plan:   Informed Consent: I have reviewed the patients History and Physical, chart, labs and discussed the procedure including the risks, benefits and alternatives for the proposed anesthesia  with the patient or authorized representative who has indicated his/her understanding and acceptance.     Dental Advisory Given  Plan Discussed with: Anesthesiologist  Anesthesia Plan Comments: (Patient reports no bleeding problems and no anticoagulant use.   Patient consented for risks of anesthesia including but not limited to:  - adverse reactions to medications - risk of bleeding, infection and or nerve damage from epidural that could lead to paralysis - risk of headache or failed epidural - nerve damage due to positioning - that if epidural is used for C-section that there is a chance of epidural failure requiring spinal placement or conversion to GA - Damage to heart, brain, lungs, other parts of body or loss of life  Patient voiced understanding.)        Anesthesia Quick Evaluation

## 2021-12-09 NOTE — Progress Notes (Signed)
Post Partum Day 0 Subjective: Florentine is about 5 hours postpartum and is tired but doing well. Baby has been in the SCN but should be coming out to the floor soon. She is ambulating and voiding without difficulty. Her pain is well controlled and bleeding is WNL. She is tolerating POs. She plans to breast feed and pump PRN. She would like a BTL but not during this admission. Her BPs have been WNL since birth.  Objective: Blood pressure 138/88, pulse 90, temperature 98.5 F (36.9 C), resp. rate 18, height 5' (1.524 m), weight 89 kg, last menstrual period 03/08/2021, SpO2 100 %, unknown if currently breastfeeding.  Physical Exam:  General: alert and cooperative Cardiac: RRR Lungs: CTAB, occasional wheezes that clear with cough Lochia: appropriate Uterine Fundus: firm Perineum: healing well   Recent Labs    12/09/21 0042  HGB 13.1  HCT 40.2    Assessment/Plan: Continue to monitor BPs Lactation support Continue inpatient care Plan for discharge 10/2 or 10/3   LOS: 0 days   Lurlean Horns, CNM 12/09/2021, 9:34 AM

## 2021-12-09 NOTE — Lactation Note (Signed)
This note was copied from a baby's chart. Lactation Consultation Note  Patient Name: Yvonne Reid Date: 12/09/2021 Reason for consult: Initial assessment;1st time breastfeeding;Term;Breastfeeding assistance;RN request;Other (Comment) (SGA) Age:36 hours  Maternal Data This is mom's 2nd baby, VBAC, spontaneous. Her first child is 62 years old. Mom did not breastfeed her first child. Mom with history of AMA, gestational HTN, gestational DM(diet controlled). Baby is SGA. Mom is breastfeeding and supplementing after each breastfeeding per MD recommendation with formula supplement(neosure 22 cal/oz) Has patient been taught Hand Expression?: Yes Does the patient have breastfeeding experience prior to this delivery?: No  Feeding Mother's Current Feeding Choice: Breast Milk and Formula Nipple Type: Slow - flow Observed baby latched well in cross cradle hold breastfeeding with audible swallows. Encouraged mom to massage her breast to keep baby interested in feeding.  LATCH Score Latch: Grasps breast easily, tongue down, lips flanged, rhythmical sucking.  Audible Swallowing: Spontaneous and intermittent  Type of Nipple: Everted at rest and after stimulation  Comfort (Breast/Nipple): Soft / non-tender  Hold (Positioning): Assistance needed to correctly position infant at breast and maintain latch.  LATCH Score: 9   Interventions Interventions: Breast feeding basics reviewed;Support pillows  Discharge Pump: Personal  Consult Status Consult Status: Follow-up Date: 12/10/21 Follow-up type: In-patient  Update provided to care nurse.  Jonna Kieryn Burtis 12/09/2021, 1:33 PM

## 2021-12-09 NOTE — Progress Notes (Signed)
Provider at bedside assessing patient. I, RN at bedside adjusting toco and assisting patient with positioning.

## 2021-12-10 ENCOUNTER — Encounter: Payer: Self-pay | Admitting: Certified Nurse Midwife

## 2021-12-10 DIAGNOSIS — Z3A39 39 weeks gestation of pregnancy: Secondary | ICD-10-CM

## 2021-12-10 DIAGNOSIS — O139 Gestational [pregnancy-induced] hypertension without significant proteinuria, unspecified trimester: Secondary | ICD-10-CM

## 2021-12-10 DIAGNOSIS — O34219 Maternal care for unspecified type scar from previous cesarean delivery: Secondary | ICD-10-CM | POA: Diagnosis not present

## 2021-12-10 LAB — CBC
HCT: 30.4 % — ABNORMAL LOW (ref 36.0–46.0)
Hemoglobin: 9.8 g/dL — ABNORMAL LOW (ref 12.0–15.0)
MCH: 27.7 pg (ref 26.0–34.0)
MCHC: 32.2 g/dL (ref 30.0–36.0)
MCV: 85.9 fL (ref 80.0–100.0)
Platelets: 236 10*3/uL (ref 150–400)
RBC: 3.54 MIL/uL — ABNORMAL LOW (ref 3.87–5.11)
RDW: 13.2 % (ref 11.5–15.5)
WBC: 14.3 10*3/uL — ABNORMAL HIGH (ref 4.0–10.5)
nRBC: 0 % (ref 0.0–0.2)

## 2021-12-10 LAB — RPR: RPR Ser Ql: NONREACTIVE

## 2021-12-10 MED ORDER — FERROUS SULFATE 325 (65 FE) MG PO TABS: 325.0000 mg | ORAL_TABLET | Freq: Every day | ORAL | 3 refills | Status: DC

## 2021-12-10 MED ORDER — FERROUS SULFATE 325 (65 FE) MG PO TABS
325.0000 mg | ORAL_TABLET | Freq: Every day | ORAL | Status: DC
  Administered 2021-12-10: 325 mg via ORAL
  Filled 2021-12-10: qty 1

## 2021-12-10 NOTE — Progress Notes (Signed)
Mother discharged. Discharge instructions given. Mother verbalizes understanding. Transported by axillary.  

## 2021-12-10 NOTE — Anesthesia Postprocedure Evaluation (Signed)
Anesthesia Post Note  Patient: Yvonne Reid  Procedure(s) Performed: AN AD HOC LABOR EPIDURAL  Patient location during evaluation: Mother Baby Anesthesia Type: Epidural Level of consciousness: awake and alert Pain management: pain level controlled Vital Signs Assessment: post-procedure vital signs reviewed and stable Respiratory status: spontaneous breathing, nonlabored ventilation and respiratory function stable Cardiovascular status: stable Postop Assessment: no headache, patient able to bend at knees, able to ambulate and adequate PO intake Anesthetic complications: no   No notable events documented.   Last Vitals:  Vitals:   12/09/21 2358 12/10/21 0415  BP: 131/81 125/86  Pulse: 79 82  Resp: 19 17  Temp: 36.5 C 36.6 C  SpO2: 97% 98%    Last Pain:  Vitals:   12/10/21 0415  TempSrc: Oral  PainSc: 0-No pain                 Rolla Plate P

## 2021-12-11 ENCOUNTER — Encounter: Payer: Self-pay | Admitting: Obstetrics and Gynecology

## 2021-12-11 ENCOUNTER — Other Ambulatory Visit: Payer: 59

## 2021-12-13 ENCOUNTER — Inpatient Hospital Stay: Admit: 2021-12-13 | Payer: Self-pay

## 2021-12-24 ENCOUNTER — Ambulatory Visit (INDEPENDENT_AMBULATORY_CARE_PROVIDER_SITE_OTHER): Payer: 59 | Admitting: Advanced Practice Midwife

## 2021-12-24 ENCOUNTER — Encounter: Payer: Self-pay | Admitting: Advanced Practice Midwife

## 2021-12-24 VITALS — BP 151/85 | HR 94 | Ht 61.0 in | Wt 176.0 lb

## 2021-12-24 DIAGNOSIS — O135 Gestational [pregnancy-induced] hypertension without significant proteinuria, complicating the puerperium: Secondary | ICD-10-CM

## 2021-12-24 DIAGNOSIS — O139 Gestational [pregnancy-induced] hypertension without significant proteinuria, unspecified trimester: Secondary | ICD-10-CM

## 2021-12-24 DIAGNOSIS — Z0130 Encounter for examination of blood pressure without abnormal findings: Secondary | ICD-10-CM

## 2021-12-24 MED ORDER — LABETALOL HCL 100 MG PO TABS: 100.0000 mg | ORAL_TABLET | Freq: Two times a day (BID) | ORAL | 6 refills | Status: DC

## 2021-12-24 NOTE — Progress Notes (Signed)
Obstetrics & Gynecology Office Visit   Chief Complaint:  Chief Complaint  Patient presents with   Postpartum Care    History of Present Illness: 36 y.o. [id number] being seen for follow up blood pressure check today.  The patient is  2 weeks postpartum The established diagnosis for the patient is preeclampsia without severe features.  She is currently on no antihypertensives.  She reports occasional mild headaches.  Medication list reviewed medications which may contribute to BP elevation were not noted.  She reports improvements with breastfeeding.  Review of Systems:  Review of Systems  Constitutional:  Negative for chills and fever.  HENT:  Negative for congestion, ear discharge, ear pain, hearing loss, sinus pain and sore throat.   Eyes:  Negative for blurred vision and double vision.  Respiratory:  Negative for cough, shortness of breath and wheezing.   Cardiovascular:  Negative for chest pain, palpitations and leg swelling.  Gastrointestinal:  Negative for abdominal pain, blood in stool, constipation, diarrhea, heartburn, melena, nausea and vomiting.  Genitourinary:  Negative for dysuria, flank pain, frequency, hematuria and urgency.  Musculoskeletal:  Negative for back pain, joint pain and myalgias.  Skin:  Negative for itching and rash.  Neurological:  Positive for headaches. Negative for dizziness, tingling, tremors, sensory change, speech change, focal weakness, seizures, loss of consciousness and weakness.  Endo/Heme/Allergies:  Negative for environmental allergies. Does not bruise/bleed easily.  Psychiatric/Behavioral:  Negative for depression, hallucinations, memory loss, substance abuse and suicidal ideas. The patient is not nervous/anxious and does not have insomnia.      Past Medical History:  Past Medical History:  Diagnosis Date   Asthma    Asthma    Gestational diabetes    Headache    Migraines    Seizures (HCC)    PSEUDOSEIZURES   Seizures (HCC)     psuedo    Past Surgical History:  Past Surgical History:  Procedure Laterality Date   CESAREAN SECTION  2009   I & D EXTREMITY Right 08/24/2015   Procedure: IRRIGATION AND DEBRIDEMENT EXTREMITY;  Surgeon: Kieth Brightly, MD;  Location: [facility] ORS;  Service: General;  Laterality: Right;    Gynecologic History: No LMP recorded.  Obstetric History: [id number]  Family History:  Family History  Problem Relation Age of Onset   Heart block Father    Cervical cancer Sister    Breast cancer Maternal Aunt    Diabetes Maternal Grandmother    Diabetes Maternal Grandfather     Social History:  Social History   Socioeconomic History   Marital status: Married    Spouse name: Aneta Mins   Number of children: Not on file   Years of education: Not on file   Highest education level: Not on file  Occupational History   Not on file  Tobacco Use   Smoking status: Former    Packs/day: 0.25    Years: 14.00    Total pack years: 3.50    Types: Cigarettes    Quit date: 04/11/2021    Years since quitting: 0.7   Smokeless tobacco: Never  Vaping Use   Vaping Use: Never used  Substance and Sexual Activity   Alcohol use: Not Currently    Comment: social   Drug use: No   Sexual activity: Yes    Birth control/protection: Surgical  Other Topics Concern   Not on file  Social History Narrative   Not on file   Social Determinants of Health   Financial Resource Strain: Not  on file  Food Insecurity: No Food Insecurity (12/09/2021)   Hunger Vital Sign    Worried About Running Out of Food in the Last Year: Never true    Ran Out of Food in the Last Year: Never true  Transportation Needs: No Transportation Needs (12/09/2021)   PRAPARE - Hydrologist (Medical): No    Lack of Transportation (Non-Medical): No  Physical Activity: Not on file  Stress: Not on file  Social Connections: Not on file  Intimate Partner Violence: Not on file    Allergies:  Allergies   Allergen Reactions   Sulfa Antibiotics Other (See Comments)    Medications: Prior to Admission medications   Medication Sig Start Date End Date Taking? Authorizing Provider  albuterol (VENTOLIN HFA) 108 (90 Base) MCG/ACT inhaler Inhale 1-2 puffs into the lungs every 6 (six) hours as needed for wheezing or shortness of breath. 03/21/20  Yes Verda Cumins, MD  ferrous sulfate 325 (65 FE) MG tablet Take 1 tablet (325 mg total) by mouth daily with breakfast. 12/10/21  Yes Swanson, Ian Bushman, CNM  labetalol (NORMODYNE) 100 MG tablet Take 1 tablet (100 mg total) by mouth 2 (two) times daily. 12/24/21  Yes Rod Can, CNM  Prenatal Vit-Fe Fumarate-FA (MULTIVITAMIN-PRENATAL) 27-0.8 MG TABS tablet Take 1 tablet by mouth daily at 12 noon.   Yes [provider]    Physical Exam Blood pressure (!) 151/85, pulse 94, height 5\' 1"  (1.549 m), weight 176 lb (79.8 kg), currently breastfeeding.  No LMP recorded.  General: NAD HEENT: normocephalic, anicteric Pulmonary: No increased work of breathing Cardiovascular: RRR, distal pulses 2+ Extremities: no edema, no erythema, no tenderness Neurologic: Grossly intact Psychiatric: mood appropriate, affect full   Edinburgh Postnatal Depression Scale - 12/24/21 1333       Edinburgh Postnatal Depression Scale:  In the Past 7 Days   I have been able to laugh and see the funny side of things. 0    I have looked forward with enjoyment to things. 0    I have blamed myself unnecessarily when things went wrong. 2    I have been anxious or worried for no good reason. 1    I have felt scared or panicky for no good reason. 1    Things have been getting on top of me. 1    I have been so unhappy that I have had difficulty sleeping. 0    I have felt sad or miserable. 0    I have been so unhappy that I have been crying. 1    The thought of harming myself has occurred to me. 0    Edinburgh Postnatal Depression Scale Total 6               Assessment: 36 y.o. [id number] presenting for blood pressure evaluation today, mildly elevated/asymptomatic  Plan: Problem List Items Addressed This Visit   None Visit Diagnoses     Blood pressure check    -  Primary   Relevant Orders   Protein / creatinine ratio, urine   CBC   Comprehensive metabolic panel   Pregnancy induced hypertension, postpartum       Relevant Medications   labetalol (NORMODYNE) 100 MG tablet   Other Relevant Orders   Protein / creatinine ratio, urine   CBC   Comprehensive metabolic panel       1) Blood pressure - blood pressure at today's visit is elevated.  As a result antihypertensive therapy will be  initiated. - additional blood work was obtained including:, CBC, CMP, and P/C ratio   Tresea Mall, CNM [facility] 12/24/2021, 1:39 PM

## 2021-12-25 LAB — COMPREHENSIVE METABOLIC PANEL
ALT: 30 IU/L (ref 0–32)
AST: 20 IU/L (ref 0–40)
Albumin/Globulin Ratio: 1.7 (ref 1.2–2.2)
Albumin: 4 g/dL (ref 3.9–4.9)
Alkaline Phosphatase: 162 IU/L — ABNORMAL HIGH (ref 44–121)
BUN/Creatinine Ratio: 18 (ref 9–23)
BUN: 9 mg/dL (ref 6–20)
Bilirubin Total: 0.2 mg/dL (ref 0.0–1.2)
CO2: 21 mmol/L (ref 20–29)
Calcium: 9.2 mg/dL (ref 8.7–10.2)
Chloride: 107 mmol/L — ABNORMAL HIGH (ref 96–106)
Creatinine, Ser: 0.51 mg/dL — ABNORMAL LOW (ref 0.57–1.00)
Globulin, Total: 2.4 g/dL (ref 1.5–4.5)
Glucose: 83 mg/dL (ref 70–99)
Potassium: 4.4 mmol/L (ref 3.5–5.2)
Sodium: 141 mmol/L (ref 134–144)
Total Protein: 6.4 g/dL (ref 6.0–8.5)
eGFR: 125 mL/min/{1.73_m2} (ref >59)

## 2021-12-25 LAB — CBC
Hematocrit: 37.8 % (ref 34.0–46.6)
Hemoglobin: 12.3 g/dL (ref 11.1–15.9)
MCH: 27.2 pg (ref 26.6–33.0)
MCHC: 32.5 g/dL (ref 31.5–35.7)
MCV: 83 fL (ref 79–97)
Platelets: 501 10*3/uL — ABNORMAL HIGH (ref 150–450)
RBC: 4.53 x10E6/uL (ref 3.77–5.28)
RDW: 12.8 % (ref 11.7–15.4)
WBC: 7 10*3/uL (ref 3.4–10.8)

## 2021-12-25 LAB — PROTEIN / CREATININE RATIO, URINE
Creatinine, Urine: 21.5 mg/dL
Protein, Ur: 9.7 mg/dL
Protein/Creat Ratio: 451 mg/g creat — ABNORMAL HIGH (ref 0–200)

## 2021-12-28 ENCOUNTER — Ambulatory Visit: Payer: 59 | Admitting: Advanced Practice Midwife

## 2022-01-08 ENCOUNTER — Ambulatory Visit (INDEPENDENT_AMBULATORY_CARE_PROVIDER_SITE_OTHER): Payer: 59 | Admitting: Obstetrics and Gynecology

## 2022-01-08 ENCOUNTER — Encounter: Payer: Self-pay | Admitting: Obstetrics and Gynecology

## 2022-01-08 VITALS — BP 116/82 | HR 75 | Ht 61.0 in | Wt 174.4 lb

## 2022-01-08 DIAGNOSIS — O165 Unspecified maternal hypertension, complicating the puerperium: Secondary | ICD-10-CM

## 2022-01-08 DIAGNOSIS — Z01818 Encounter for other preprocedural examination: Secondary | ICD-10-CM

## 2022-01-08 DIAGNOSIS — O135 Gestational [pregnancy-induced] hypertension without significant proteinuria, complicating the puerperium: Secondary | ICD-10-CM

## 2022-01-08 DIAGNOSIS — Z0130 Encounter for examination of blood pressure without abnormal findings: Secondary | ICD-10-CM

## 2022-01-08 DIAGNOSIS — Z3009 Encounter for other general counseling and advice on contraception: Secondary | ICD-10-CM

## 2022-01-08 NOTE — Progress Notes (Signed)
Patient presents today for 4 week postpartum and blood pressure follow-up. Patient had a vaginal delivery on 12/10/21, still lightly bleeding.  She is breast feeding. She states she would like BTL for birth control, pre-op today. EPDS score of 6 . Patient states no other questions or concerns at this time.

## 2022-01-08 NOTE — H&P (Signed)
PRE-OPERATIVE HISTORY AND PHYSICAL EXAM  PCP:  Kathrine Haddock, NP Subjective:   HPI:  Yvonne Reid is a 36 y.o. [id number].  No LMP recorded.  She presents today for a pre-op discussion and PE.  She has the following symptoms: Approximately 4 weeks postpartum she had elevated blood pressure 2 weeks ago.  She is on labetalol at this time.  She also presents today because she desires permanent sterilization.  She has previously tried [place] IUD without success.  She is entirely convinced she would like permanent sterilization.  Review of Systems:   Constitutional: Denied constitutional symptoms, night sweats, recent illness, fatigue, fever, insomnia and weight loss.  Eyes: Denied eye symptoms, eye pain, photophobia, vision change and visual disturbance.  Ears/Nose/Throat/Neck: Denied ear, nose, throat or neck symptoms, hearing loss, nasal discharge, sinus congestion and sore throat.  Cardiovascular: Denied cardiovascular symptoms, arrhythmia, chest pain/pressure, edema, exercise intolerance, orthopnea and palpitations.  Respiratory: Denied pulmonary symptoms, asthma, pleuritic pain, productive sputum, cough, dyspnea and wheezing.  Gastrointestinal: Denied, gastro-esophageal reflux, melena, nausea and vomiting.  Genitourinary: Denied genitourinary symptoms including symptomatic vaginal discharge, pelvic relaxation issues, and urinary complaints.  Musculoskeletal: Denied musculoskeletal symptoms, stiffness, swelling, muscle weakness and myalgia.  Dermatologic: Denied dermatology symptoms, rash and scar.  Neurologic: Denied neurology symptoms, dizziness, headache, neck pain and syncope.  Psychiatric: Denied psychiatric symptoms, anxiety and depression.  Endocrine: Denied endocrine symptoms including hot flashes and night sweats.   OB History  Gravida Para Term Preterm AB Living  3 2 1 1 1 2   SAB IAB Ectopic Multiple Live Births  1     0 2    # Outcome Date GA Lbr Len/2nd Weight Sex  Delivery Anes PTL Lv  3 Term 12/09/21 [redacted]w[redacted]d  5 lb 2.9 oz (2.35 kg) F VBAC EPI  LIV  2 SAB 2019          1 Preterm 2009     CS-Unspec   LIV    Past Medical History:  Diagnosis Date   Asthma    Asthma    Gestational diabetes    Headache    Migraines    Seizures (HCC)    PSEUDOSEIZURES   Seizures ([place])    psuedo    Past Surgical History:  Procedure Laterality Date   CESAREAN SECTION  2009   I & D EXTREMITY Right 08/24/2015   Procedure: IRRIGATION AND DEBRIDEMENT EXTREMITY;  Surgeon: Christene Lye, MD;  Location: [facility] ORS;  Service: General;  Laterality: Right;      SOCIAL HISTORY:  Social History   Tobacco Use  Smoking Status Former   Packs/day: 0.25   Years: 14.00   Total pack years: 3.50   Types: Cigarettes   Quit date: 04/11/2021   Years since quitting: 0.7  Smokeless Tobacco Never   Social History   Substance and Sexual Activity  Alcohol Use Not Currently   Comment: social    Social History   Substance and Sexual Activity  Drug Use No    Family History  Problem Relation Age of Onset   Heart block Father    Cervical cancer Sister    Breast cancer Maternal Aunt    Diabetes Maternal Grandmother    Diabetes Maternal Grandfather     ALLERGIES:  Sulfa antibiotics  MEDS:   Current Outpatient Medications on File Prior to Visit  Medication Sig Dispense Refill   albuterol (VENTOLIN HFA) 108 (90 Base) MCG/ACT inhaler Inhale 1-2 puffs into the lungs  every 6 (six) hours as needed for wheezing or shortness of breath. 1 each 0   ferrous sulfate 325 (65 FE) MG tablet Take 1 tablet (325 mg total) by mouth daily with breakfast. 30 tablet 3   labetalol (NORMODYNE) 100 MG tablet Take 1 tablet (100 mg total) by mouth 2 (two) times daily. 60 tablet 6   Prenatal Vit-Fe Fumarate-FA (MULTIVITAMIN-PRENATAL) 27-0.8 MG TABS tablet Take 1 tablet by mouth daily at 12 noon.     No current facility-administered medications on file prior to visit.    No orders of the  defined types were placed in this encounter.    Physical examination BP 116/82   Pulse 75   Ht 5\' 1"  (1.549 m)   Wt 174 lb 6.4 oz (79.1 kg)   Breastfeeding Yes   BMI 32.95 kg/m   General NAD, Conversant  HEENT Atraumatic; Op clear with mmm.  Normo-cephalic. Pupils reactive. Anicteric sclerae  Thyroid/Neck Smooth without nodularity or enlargement. Normal ROM.  Neck Supple.  Skin No rashes, lesions or ulceration. Normal palpated skin turgor. No nodularity.  Breasts: No masses or discharge.  Symmetric.  No axillary adenopathy.  Lungs: Clear to auscultation.No rales or wheezes. Normal Respiratory effort, no retractions.  Heart: NSR.  No murmurs or rubs appreciated. No periferal edema  Abdomen: Soft.  Non-tender.  No masses.  No HSM. No hernia  Extremities: Moves all appropriately.  Normal ROM for age. No lymphadenopathy.  Neuro: Oriented to PPT.  Normal mood. Normal affect.     Pelvic:   Vulva: Normal appearance.  No lesions.  Vagina: No lesions or abnormalities noted.  Support: Normal pelvic support.  Urethra No masses tenderness or scarring.  Meatus Normal size without lesions or prolapse.  Cervix: Normal ectropion.  No lesions.  Anus: Normal exam.  No lesions.  Perineum: Normal exam.  No lesions.        Bimanual   Uterus: Normal size.  Non-tender.  Mobile.  AV.  Adnexae: No masses.  Non-tender to palpation.  Cul-de-sac: Negative for abnormality.   Assessment:   Patient Active Problem List   Diagnosis Date Noted   VBAC, delivered, current hospitalization 12/10/2021   Postpartum care following vaginal delivery 12/10/2021   Encounter for care or examination of lactating mother 12/10/2021   Gestational hypertension affecting third pregnancy 12/10/2021   Supervision of high risk pregnancy in third trimester 10/26/2021   Unwanted fertility 10/26/2021   History of cesarean delivery 07/04/2021   History of placenta abruption 07/04/2021   History of asthma 08/18/2020     1. Pre-op exam   2. Blood pressure check   3. Pregnancy induced hypertension, postpartum   4. Consultation for female sterilization      Blood pressure currently controlled on labetalol.  Plan discontinuation after surgery.  Plan:   Orders: No orders of the defined types were placed in this encounter.    1.  Laparoscopic bilateral tubal sterilization with Filshie clips

## 2022-01-08 NOTE — Progress Notes (Signed)
PRE-OPERATIVE HISTORY AND PHYSICAL EXAM  PCP:  Kathrine Haddock, NP Subjective:   HPI:  Yvonne Reid is a 36 y.o. [id number].  No LMP recorded.  She presents today for a pre-op discussion and PE.  She has the following symptoms: Approximately 4 weeks postpartum she had elevated blood pressure 2 weeks ago.  She is on labetalol at this time.  She also presents today because she desires permanent sterilization.  She has previously tried [place] IUD without success.  She is entirely convinced she would like permanent sterilization.  Review of Systems:   Constitutional: Denied constitutional symptoms, night sweats, recent illness, fatigue, fever, insomnia and weight loss.  Eyes: Denied eye symptoms, eye pain, photophobia, vision change and visual disturbance.  Ears/Nose/Throat/Neck: Denied ear, nose, throat or neck symptoms, hearing loss, nasal discharge, sinus congestion and sore throat.  Cardiovascular: Denied cardiovascular symptoms, arrhythmia, chest pain/pressure, edema, exercise intolerance, orthopnea and palpitations.  Respiratory: Denied pulmonary symptoms, asthma, pleuritic pain, productive sputum, cough, dyspnea and wheezing.  Gastrointestinal: Denied, gastro-esophageal reflux, melena, nausea and vomiting.  Genitourinary: Denied genitourinary symptoms including symptomatic vaginal discharge, pelvic relaxation issues, and urinary complaints.  Musculoskeletal: Denied musculoskeletal symptoms, stiffness, swelling, muscle weakness and myalgia.  Dermatologic: Denied dermatology symptoms, rash and scar.  Neurologic: Denied neurology symptoms, dizziness, headache, neck pain and syncope.  Psychiatric: Denied psychiatric symptoms, anxiety and depression.  Endocrine: Denied endocrine symptoms including hot flashes and night sweats.   OB History  Gravida Para Term Preterm AB Living  3 2 1 1 1 2   SAB IAB Ectopic Multiple Live Births  1     0 2    # Outcome Date GA Lbr Len/2nd Weight Sex  Delivery Anes PTL Lv  3 Term 12/09/21 [redacted]w[redacted]d  5 lb 2.9 oz (2.35 kg) F VBAC EPI  LIV  2 SAB 2019          1 Preterm 2009     CS-Unspec   LIV    Past Medical History:  Diagnosis Date   Asthma    Asthma    Gestational diabetes    Headache    Migraines    Seizures (HCC)    PSEUDOSEIZURES   Seizures ([place])    psuedo    Past Surgical History:  Procedure Laterality Date   CESAREAN SECTION  2009   I & D EXTREMITY Right 08/24/2015   Procedure: IRRIGATION AND DEBRIDEMENT EXTREMITY;  Surgeon: Christene Lye, MD;  Location: [facility] ORS;  Service: General;  Laterality: Right;      SOCIAL HISTORY:  Social History   Tobacco Use  Smoking Status Former   Packs/day: 0.25   Years: 14.00   Total pack years: 3.50   Types: Cigarettes   Quit date: 04/11/2021   Years since quitting: 0.7  Smokeless Tobacco Never   Social History   Substance and Sexual Activity  Alcohol Use Not Currently   Comment: social    Social History   Substance and Sexual Activity  Drug Use No    Family History  Problem Relation Age of Onset   Heart block Father    Cervical cancer Sister    Breast cancer Maternal Aunt    Diabetes Maternal Grandmother    Diabetes Maternal Grandfather     ALLERGIES:  Sulfa antibiotics  MEDS:   Current Outpatient Medications on File Prior to Visit  Medication Sig Dispense Refill   albuterol (VENTOLIN HFA) 108 (90 Base) MCG/ACT inhaler Inhale 1-2 puffs into the lungs  every 6 (six) hours as needed for wheezing or shortness of breath. 1 each 0   ferrous sulfate 325 (65 FE) MG tablet Take 1 tablet (325 mg total) by mouth daily with breakfast. 30 tablet 3   labetalol (NORMODYNE) 100 MG tablet Take 1 tablet (100 mg total) by mouth 2 (two) times daily. 60 tablet 6   Prenatal Vit-Fe Fumarate-FA (MULTIVITAMIN-PRENATAL) 27-0.8 MG TABS tablet Take 1 tablet by mouth daily at 12 noon.     No current facility-administered medications on file prior to visit.    No orders of the  defined types were placed in this encounter.    Physical examination BP 116/82   Pulse 75   Ht 5\' 1"  (1.549 m)   Wt 174 lb 6.4 oz (79.1 kg)   Breastfeeding Yes   BMI 32.95 kg/m   General NAD, Conversant  HEENT Atraumatic; Op clear with mmm.  Normo-cephalic. Pupils reactive. Anicteric sclerae  Thyroid/Neck Smooth without nodularity or enlargement. Normal ROM.  Neck Supple.  Skin No rashes, lesions or ulceration. Normal palpated skin turgor. No nodularity.  Breasts: No masses or discharge.  Symmetric.  No axillary adenopathy.  Lungs: Clear to auscultation.No rales or wheezes. Normal Respiratory effort, no retractions.  Heart: NSR.  No murmurs or rubs appreciated. No periferal edema  Abdomen: Soft.  Non-tender.  No masses.  No HSM. No hernia  Extremities: Moves all appropriately.  Normal ROM for age. No lymphadenopathy.  Neuro: Oriented to PPT.  Normal mood. Normal affect.     Pelvic:   Vulva: Normal appearance.  No lesions.  Vagina: No lesions or abnormalities noted.  Support: Normal pelvic support.  Urethra No masses tenderness or scarring.  Meatus Normal size without lesions or prolapse.  Cervix: Normal ectropion.  No lesions.  Anus: Normal exam.  No lesions.  Perineum: Normal exam.  No lesions.        Bimanual   Uterus: Normal size.  Non-tender.  Mobile.  AV.  Adnexae: No masses.  Non-tender to palpation.  Cul-de-sac: Negative for abnormality.   Assessment:   [id number] Patient Active Problem List   Diagnosis Date Noted   VBAC, delivered, current hospitalization 12/10/2021   Postpartum care following vaginal delivery 12/10/2021   Encounter for care or examination of lactating mother 12/10/2021   Gestational hypertension affecting third pregnancy 12/10/2021   Supervision of high risk pregnancy in third trimester 10/26/2021   Unwanted fertility 10/26/2021   History of cesarean delivery 07/04/2021   History of placenta abruption 07/04/2021   History of asthma 08/18/2020     1. Pre-op exam   2. Blood pressure check   3. Pregnancy induced hypertension, postpartum   4. Consultation for female sterilization      Blood pressure currently controlled on labetalol.  Plan discontinuation after surgery.  Plan:   Orders: No orders of the defined types were placed in this encounter.    1.  Laparoscopic bilateral tubal sterilization with Filshie clips  Pre-op discussions regarding Risks and Benefits of her scheduled surgery.  Tubal We have discussed permanent sterilization in detail.  I have reviewed Filshie clip placement as a means of sterilization.  I have explained the risks and benefits of this procedure in detail.  I have stressed the fact that this is a permanent and not reversible procedure and there is a failure rate of 3 to7 per 1000 which is somewhat timing and method dependent.  I have discussed the possibility of inadvertent damage to bowel, bladder, blood vessels or  other internal organs..  I have specifically discussed the risk of anesthesia, infection and blood loss with her.  We have discussed the possibility of laparotomy should there be a problem and the additional possibility of a longer hospital stay.  I have spoken with her regarding the recovery period, informing her that after this procedure, most people are performing their normal daily activities within one week.  I have recommended abstinence or another birth control method for 2-4 weeks following this procedure.  Other options of birth control have also been discussed.  The procedure of sterilization and alternate methods of birth control were discussed in detail with the patient.  I have answered all her questions and I believe that she has an adequate and informed understanding of the procedure.  I spent 31 minutes involved in the care of this patient preparing to see the patient by obtaining and reviewing her medical history (including labs, imaging tests and prior procedures), documenting  clinical information in the electronic health record (EHR), counseling and coordinating care plans, writing and sending prescriptions, ordering tests or procedures and in direct communicating with the patient and medical staff discussing pertinent items from her history and physical exam.  Finis Bud, M.D. 01/08/2022 8:59 AM

## 2022-01-08 NOTE — H&P (View-Only) (Signed)
PRE-OPERATIVE HISTORY AND PHYSICAL EXAM  PCP:  Kathrine Haddock, NP Subjective:   HPI:  Yvonne Reid is a 36 y.o. [id number].  No LMP recorded.  She presents today for a pre-op discussion and PE.  She has the following symptoms: Approximately 4 weeks postpartum she had elevated blood pressure 2 weeks ago.  She is on labetalol at this time.  She also presents today because she desires permanent sterilization.  She has previously tried [place] IUD without success.  She is entirely convinced she would like permanent sterilization.  Review of Systems:   Constitutional: Denied constitutional symptoms, night sweats, recent illness, fatigue, fever, insomnia and weight loss.  Eyes: Denied eye symptoms, eye pain, photophobia, vision change and visual disturbance.  Ears/Nose/Throat/Neck: Denied ear, nose, throat or neck symptoms, hearing loss, nasal discharge, sinus congestion and sore throat.  Cardiovascular: Denied cardiovascular symptoms, arrhythmia, chest pain/pressure, edema, exercise intolerance, orthopnea and palpitations.  Respiratory: Denied pulmonary symptoms, asthma, pleuritic pain, productive sputum, cough, dyspnea and wheezing.  Gastrointestinal: Denied, gastro-esophageal reflux, melena, nausea and vomiting.  Genitourinary: Denied genitourinary symptoms including symptomatic vaginal discharge, pelvic relaxation issues, and urinary complaints.  Musculoskeletal: Denied musculoskeletal symptoms, stiffness, swelling, muscle weakness and myalgia.  Dermatologic: Denied dermatology symptoms, rash and scar.  Neurologic: Denied neurology symptoms, dizziness, headache, neck pain and syncope.  Psychiatric: Denied psychiatric symptoms, anxiety and depression.  Endocrine: Denied endocrine symptoms including hot flashes and night sweats.   OB History  Gravida Para Term Preterm AB Living  3 2 1 1 1 2   SAB IAB Ectopic Multiple Live Births  1     0 2    # Outcome Date GA Lbr Len/2nd Weight Sex  Delivery Anes PTL Lv  3 Term 12/09/21 [redacted]w[redacted]d  5 lb 2.9 oz (2.35 kg) F VBAC EPI  LIV  2 SAB 2019          1 Preterm 2009     CS-Unspec   LIV    Past Medical History:  Diagnosis Date   Asthma    Asthma    Gestational diabetes    Headache    Migraines    Seizures (HCC)    PSEUDOSEIZURES   Seizures ([place])    psuedo    Past Surgical History:  Procedure Laterality Date   CESAREAN SECTION  2009   I & D EXTREMITY Right 08/24/2015   Procedure: IRRIGATION AND DEBRIDEMENT EXTREMITY;  Surgeon: Christene Lye, MD;  Location: [facility] ORS;  Service: General;  Laterality: Right;      SOCIAL HISTORY:  Social History   Tobacco Use  Smoking Status Former   Packs/day: 0.25   Years: 14.00   Total pack years: 3.50   Types: Cigarettes   Quit date: 04/11/2021   Years since quitting: 0.7  Smokeless Tobacco Never   Social History   Substance and Sexual Activity  Alcohol Use Not Currently   Comment: social    Social History   Substance and Sexual Activity  Drug Use No    Family History  Problem Relation Age of Onset   Heart block Father    Cervical cancer Sister    Breast cancer Maternal Aunt    Diabetes Maternal Grandmother    Diabetes Maternal Grandfather     ALLERGIES:  Sulfa antibiotics  MEDS:   Current Outpatient Medications on File Prior to Visit  Medication Sig Dispense Refill   albuterol (VENTOLIN HFA) 108 (90 Base) MCG/ACT inhaler Inhale 1-2 puffs into the lungs  every 6 (six) hours as needed for wheezing or shortness of breath. 1 each 0   ferrous sulfate 325 (65 FE) MG tablet Take 1 tablet (325 mg total) by mouth daily with breakfast. 30 tablet 3   labetalol (NORMODYNE) 100 MG tablet Take 1 tablet (100 mg total) by mouth 2 (two) times daily. 60 tablet 6   Prenatal Vit-Fe Fumarate-FA (MULTIVITAMIN-PRENATAL) 27-0.8 MG TABS tablet Take 1 tablet by mouth daily at 12 noon.     No current facility-administered medications on file prior to visit.    No orders of the  defined types were placed in this encounter.    Physical examination BP 116/82   Pulse 75   Ht 5\' 1"  (1.549 m)   Wt 174 lb 6.4 oz (79.1 kg)   Breastfeeding Yes   BMI 32.95 kg/m   General NAD, Conversant  HEENT Atraumatic; Op clear with mmm.  Normo-cephalic. Pupils reactive. Anicteric sclerae  Thyroid/Neck Smooth without nodularity or enlargement. Normal ROM.  Neck Supple.  Skin No rashes, lesions or ulceration. Normal palpated skin turgor. No nodularity.  Breasts: No masses or discharge.  Symmetric.  No axillary adenopathy.  Lungs: Clear to auscultation.No rales or wheezes. Normal Respiratory effort, no retractions.  Heart: NSR.  No murmurs or rubs appreciated. No periferal edema  Abdomen: Soft.  Non-tender.  No masses.  No HSM. No hernia  Extremities: Moves all appropriately.  Normal ROM for age. No lymphadenopathy.  Neuro: Oriented to PPT.  Normal mood. Normal affect.     Pelvic:   Vulva: Normal appearance.  No lesions.  Vagina: No lesions or abnormalities noted.  Support: Normal pelvic support.  Urethra No masses tenderness or scarring.  Meatus Normal size without lesions or prolapse.  Cervix: Normal ectropion.  No lesions.  Anus: Normal exam.  No lesions.  Perineum: Normal exam.  No lesions.        Bimanual   Uterus: Normal size.  Non-tender.  Mobile.  AV.  Adnexae: No masses.  Non-tender to palpation.  Cul-de-sac: Negative for abnormality.   Assessment:   Patient Active Problem List   Diagnosis Date Noted   VBAC, delivered, current hospitalization 12/10/2021   Postpartum care following vaginal delivery 12/10/2021   Encounter for care or examination of lactating mother 12/10/2021   Gestational hypertension affecting third pregnancy 12/10/2021   Supervision of high risk pregnancy in third trimester 10/26/2021   Unwanted fertility 10/26/2021   History of cesarean delivery 07/04/2021   History of placenta abruption 07/04/2021   History of asthma 08/18/2020     1. Pre-op exam   2. Blood pressure check   3. Pregnancy induced hypertension, postpartum   4. Consultation for female sterilization      Blood pressure currently controlled on labetalol.  Plan discontinuation after surgery.  Plan:   Orders: No orders of the defined types were placed in this encounter.    1.  Laparoscopic bilateral tubal sterilization with Filshie clips

## 2022-01-18 ENCOUNTER — Encounter
Admission: RE | Admit: 2022-01-18 | Discharge: 2022-01-18 | Disposition: A | Discharge status: Home / Self Care | Payer: 59 | Source: Ambulatory Visit | Attending: Obstetrics and Gynecology | Admitting: Obstetrics and Gynecology

## 2022-01-18 VITALS — Ht 61.0 in | Wt 174.4 lb

## 2022-01-18 DIAGNOSIS — Z01818 Encounter for other preprocedural examination: Secondary | ICD-10-CM

## 2022-01-18 HISTORY — DX: Gestational (pregnancy-induced) hypertension without significant proteinuria, unspecified trimester: O13.9

## 2022-01-18 HISTORY — DX: Anemia, unspecified: D64.9

## 2022-01-18 NOTE — Pre-Procedure Instructions (Signed)
Pt states that she has htn due to her recent pregnancy. She was instructed by Dr Logan Bores to take her Labetalol thru Sunday night (the night prior to her surgery) and not to take anymore in hopes that after surgery she will not have to be on bp meds anymore

## 2022-01-18 NOTE — Patient Instructions (Addendum)
Your procedure is scheduled on:01-21-22 Monday Report to the Registration Desk on the 1st floor of the [facility].Then proceed to the 2nd floor Surgery Desk To find out your arrival time, please call [phone] between 1PM - 3PM on:01-18-22 Friday If your arrival time is 6:00 am, do not arrive prior to that time as the [facility] entrance doors do not open until 6:00 am.  REMEMBER: Instructions that are not followed completely may result in serious medical risk, up to and including death; or upon the discretion of your surgeon and anesthesiologist your surgery may need to be rescheduled.  Do not eat food OR drink any liquids after midnight the night before surgery.  No gum chewing, lozengers or hard candies.  Do NOT take any medication the day of surgery  Use your Albuterol Inhaler the day of surgery and bring your Albuterol Inhaler to the hospital  One week prior to surgery: Stop Anti-inflammatories (NSAIDS) such as Advil, Aleve, Ibuprofen, Motrin, Naproxen, Naprosyn and Aspirin based products such as Excedrin, Goodys Powder, BC Powder.You may however, continue to take Tylenol if needed for pain up until the day of surgery.  Stop ANY OVER THE COUNTER supplements/vitamins  until after surgery (Continue Iron and Prenatal Vitamin)  No Alcohol for 24 hours before or after surgery.  No Smoking including e-cigarettes for 24 hours prior to surgery.  No chewable tobacco products for at least 6 hours prior to surgery.  No nicotine patches on the day of surgery.  Do not use any "recreational" drugs for at least a week prior to your surgery.  Please be advised that the combination of cocaine and anesthesia may have negative outcomes, up to and including death. If you test positive for cocaine, your surgery will be cancelled.  On the morning of surgery brush your teeth with toothpaste and water, you may rinse your mouth with mouthwash if you wish. Do not swallow any toothpaste or  mouthwash.  Do not wear jewelry, make-up, hairpins, clips or nail polish.  Do not wear lotions, powders, or perfumes.   Do not shave body from the neck down 48 hours prior to surgery just in case you cut yourself which could leave a site for infection.  Also, freshly shaved skin may become irritated if using the CHG soap.  Contact lenses, hearing aids and dentures may not be worn into surgery.  Do not bring valuables to the hospital. [facility] is not responsible for any missing/lost belongings or valuables.   Notify your doctor if there is any change in your medical condition (cold, fever, infection).  Wear comfortable clothing (specific to your surgery type) to the hospital.  After surgery, you can help prevent lung complications by doing breathing exercises.  Take deep breaths and cough every 1-2 hours. Your doctor may order a device called an Incentive Spirometer to help you take deep breaths. When coughing or sneezing, hold a pillow firmly against your incision with both hands. This is called "splinting." Doing this helps protect your incision. It also decreases belly discomfort.  If you are being admitted to the hospital overnight, leave your suitcase in the car. After surgery it may be brought to your room.  If you are being discharged the day of surgery, you will not be allowed to drive home. You will need a responsible adult (18 years or older) to drive you home and stay with you that night.   If you are taking public transportation, you will need to have a responsible  adult (18 years or older) with you. Please confirm with your physician that it is acceptable to use public transportation.   Please call the Pre-admissions Testing Dept. at [phone] if you have any questions about these instructions.  Surgery Visitation Policy:  Patients undergoing a surgery or procedure may have two family members or support persons with them as long as the person is not COVID-19  positive or experiencing its symptoms.

## 2022-01-20 MED ORDER — LACTATED RINGERS IV SOLN: INTRAVENOUS | Status: DC

## 2022-01-20 MED ORDER — CHLORHEXIDINE GLUCONATE 0.12 % MT SOLN: 15.0000 mL | Freq: Once | OROMUCOSAL | Status: DC

## 2022-01-20 MED ORDER — ORAL CARE MOUTH RINSE: 15.0000 mL | Freq: Once | OROMUCOSAL | Status: DC

## 2022-01-20 MED ORDER — POVIDONE-IODINE 10 % EX SWAB: 2.0000 | Freq: Once | CUTANEOUS | Status: DC

## 2022-01-20 MED ORDER — FAMOTIDINE 20 MG PO TABS: 20.0000 mg | ORAL_TABLET | Freq: Once | ORAL | Status: DC

## 2022-01-21 ENCOUNTER — Other Ambulatory Visit: Payer: Self-pay

## 2022-01-21 ENCOUNTER — Encounter
Admission: RE | Disposition: A | Discharge status: Home / Self Care | Payer: Self-pay | Source: Home / Self Care | Attending: Obstetrics and Gynecology

## 2022-01-21 ENCOUNTER — Ambulatory Visit
Admission: RE | Admit: 2022-01-21 | Discharge: 2022-01-21 | Disposition: A | Discharge status: Home / Self Care | Payer: 59 | Attending: Obstetrics and Gynecology | Admitting: Obstetrics and Gynecology

## 2022-01-21 ENCOUNTER — Ambulatory Visit: Payer: 59 | Admitting: Anesthesiology

## 2022-01-21 ENCOUNTER — Encounter: Payer: Self-pay | Admitting: Obstetrics and Gynecology

## 2022-01-21 DIAGNOSIS — Z87891 Personal history of nicotine dependence: Secondary | ICD-10-CM | POA: Diagnosis not present

## 2022-01-21 DIAGNOSIS — Z79899 Other long term (current) drug therapy: Secondary | ICD-10-CM | POA: Insufficient documentation

## 2022-01-21 DIAGNOSIS — J45909 Unspecified asthma, uncomplicated: Secondary | ICD-10-CM | POA: Diagnosis not present

## 2022-01-21 DIAGNOSIS — Z01818 Encounter for other preprocedural examination: Secondary | ICD-10-CM

## 2022-01-21 DIAGNOSIS — E119 Type 2 diabetes mellitus without complications: Secondary | ICD-10-CM | POA: Insufficient documentation

## 2022-01-21 DIAGNOSIS — Z302 Encounter for sterilization: Secondary | ICD-10-CM | POA: Diagnosis not present

## 2022-01-21 HISTORY — PX: LAPAROSCOPIC TUBAL LIGATION: SHX1937

## 2022-01-21 LAB — TYPE AND SCREEN
ABO/RH(D): B POS
Antibody Screen: NEGATIVE

## 2022-01-21 LAB — POCT PREGNANCY, URINE: Preg Test, Ur: NEGATIVE

## 2022-01-21 SURGERY — LIGATION, FALLOPIAN TUBE, LAPAROSCOPIC
Anesthesia: General | Laterality: Bilateral

## 2022-01-21 MED ORDER — ONDANSETRON HCL 4 MG/2ML IJ SOLN: 4.0000 mg | Freq: Four times a day (QID) | INTRAMUSCULAR | Status: DC | PRN

## 2022-01-21 MED ORDER — FENTANYL CITRATE (PF) 100 MCG/2ML IJ SOLN
INTRAMUSCULAR | Status: AC
  Filled 2022-01-21: qty 2

## 2022-01-21 MED ORDER — BUPIVACAINE HCL (PF) 0.5 % IJ SOLN
INTRAMUSCULAR | Status: AC
  Filled 2022-01-21: qty 30

## 2022-01-21 MED ORDER — ROCURONIUM BROMIDE 10 MG/ML (PF) SYRINGE
PREFILLED_SYRINGE | INTRAVENOUS | Status: AC
  Filled 2022-01-21: qty 10

## 2022-01-21 MED ORDER — ACETAMINOPHEN 10 MG/ML IV SOLN: 1000.0000 mg | Freq: Once | INTRAVENOUS | Status: DC | PRN

## 2022-01-21 MED ORDER — LACTATED RINGERS IV SOLN: INTRAVENOUS | Status: DC | PRN

## 2022-01-21 MED ORDER — OXYCODONE HCL 5 MG PO TABS
5.0000 mg | ORAL_TABLET | Freq: Once | ORAL | Status: AC | PRN
  Administered 2022-01-21: 5 mg via ORAL

## 2022-01-21 MED ORDER — DEXTROSE IN LACTATED RINGERS 5 % IV SOLN: INTRAVENOUS | Status: DC

## 2022-01-21 MED ORDER — SUGAMMADEX SODIUM 500 MG/5ML IV SOLN
INTRAVENOUS | Status: DC | PRN
  Administered 2022-01-21: 316.4 mg via INTRAVENOUS

## 2022-01-21 MED ORDER — SEVOFLURANE IN SOLN
RESPIRATORY_TRACT | Status: AC
  Filled 2022-01-21: qty 250

## 2022-01-21 MED ORDER — FENTANYL CITRATE (PF) 100 MCG/2ML IJ SOLN
INTRAMUSCULAR | Status: AC
  Administered 2022-01-21: 50 ug via INTRAVENOUS
  Filled 2022-01-21: qty 2

## 2022-01-21 MED ORDER — FENTANYL CITRATE (PF) 100 MCG/2ML IJ SOLN
INTRAMUSCULAR | Status: DC | PRN
  Administered 2022-01-21 (×4): 50 ug via INTRAVENOUS

## 2022-01-21 MED ORDER — 0.9 % SODIUM CHLORIDE (POUR BTL) OPTIME
TOPICAL | Status: DC | PRN
  Administered 2022-01-21: 200 mL

## 2022-01-21 MED ORDER — ONDANSETRON HCL 4 MG/2ML IJ SOLN: 4.0000 mg | Freq: Once | INTRAMUSCULAR | Status: DC | PRN

## 2022-01-21 MED ORDER — ACETAMINOPHEN 10 MG/ML IV SOLN
INTRAVENOUS | Status: DC | PRN
  Administered 2022-01-21: 1000 mg via INTRAVENOUS

## 2022-01-21 MED ORDER — OXYCODONE-ACETAMINOPHEN 5-325 MG PO TABS: 1.0000 | ORAL_TABLET | ORAL | Status: DC | PRN

## 2022-01-21 MED ORDER — OXYCODONE HCL 5 MG PO TABS
ORAL_TABLET | ORAL | Status: AC
  Filled 2022-01-21: qty 1

## 2022-01-21 MED ORDER — LIDOCAINE HCL (PF) 2 % IJ SOLN
INTRAMUSCULAR | Status: AC
  Filled 2022-01-21: qty 5

## 2022-01-21 MED ORDER — PROPOFOL 10 MG/ML IV BOLUS
INTRAVENOUS | Status: DC | PRN
  Administered 2022-01-21: 180 mg via INTRAVENOUS

## 2022-01-21 MED ORDER — KETOROLAC TROMETHAMINE 30 MG/ML IJ SOLN
INTRAMUSCULAR | Status: AC
  Filled 2022-01-21: qty 1

## 2022-01-21 MED ORDER — KETOROLAC TROMETHAMINE 30 MG/ML IJ SOLN
30.0000 mg | Freq: Once | INTRAMUSCULAR | Status: AC
  Administered 2022-01-21: 30 mg via INTRAVENOUS

## 2022-01-21 MED ORDER — ONDANSETRON 4 MG PO TBDP: 4.0000 mg | ORAL_TABLET | Freq: Four times a day (QID) | ORAL | Status: DC | PRN

## 2022-01-21 MED ORDER — LIDOCAINE HCL (CARDIAC) PF 100 MG/5ML IV SOSY
PREFILLED_SYRINGE | INTRAVENOUS | Status: DC | PRN
  Administered 2022-01-21: 100 mg via INTRAVENOUS

## 2022-01-21 MED ORDER — HYDROCODONE-ACETAMINOPHEN 5-325 MG PO TABS: 1.0000 | ORAL_TABLET | Freq: Four times a day (QID) | ORAL | 0 refills | Status: DC | PRN

## 2022-01-21 MED ORDER — LACTATED RINGERS IV SOLN: INTRAVENOUS | Status: DC

## 2022-01-21 MED ORDER — ALBUTEROL SULFATE HFA 108 (90 BASE) MCG/ACT IN AERS
INHALATION_SPRAY | RESPIRATORY_TRACT | Status: DC | PRN
  Administered 2022-01-21: 4 via RESPIRATORY_TRACT

## 2022-01-21 MED ORDER — ROCURONIUM BROMIDE 100 MG/10ML IV SOLN
INTRAVENOUS | Status: DC | PRN
  Administered 2022-01-21: 60 mg via INTRAVENOUS

## 2022-01-21 MED ORDER — ONDANSETRON HCL 4 MG/2ML IJ SOLN
INTRAMUSCULAR | Status: DC | PRN
  Administered 2022-01-21: 4 mg via INTRAVENOUS

## 2022-01-21 MED ORDER — BUPIVACAINE HCL (PF) 0.5 % IJ SOLN
INTRAMUSCULAR | Status: DC | PRN
  Administered 2022-01-21: 8 mL

## 2022-01-21 MED ORDER — MIDAZOLAM HCL 2 MG/2ML IJ SOLN
INTRAMUSCULAR | Status: DC | PRN
  Administered 2022-01-21: 2 mg via INTRAVENOUS

## 2022-01-21 MED ORDER — FENTANYL CITRATE (PF) 100 MCG/2ML IJ SOLN
25.0000 ug | INTRAMUSCULAR | Status: DC | PRN
  Administered 2022-01-21 (×3): 25 ug via INTRAVENOUS

## 2022-01-21 MED ORDER — PROPOFOL 10 MG/ML IV BOLUS
INTRAVENOUS | Status: AC
  Filled 2022-01-21: qty 20

## 2022-01-21 MED ORDER — OXYCODONE HCL 5 MG/5ML PO SOLN: 5.0000 mg | Freq: Once | ORAL | Status: AC | PRN

## 2022-01-21 MED ORDER — MIDAZOLAM HCL 2 MG/2ML IJ SOLN
INTRAMUSCULAR | Status: AC
  Filled 2022-01-21: qty 2

## 2022-01-21 MED ORDER — DEXAMETHASONE SODIUM PHOSPHATE 10 MG/ML IJ SOLN
INTRAMUSCULAR | Status: DC | PRN
  Administered 2022-01-21: 10 mg via INTRAVENOUS

## 2022-01-21 SURGICAL SUPPLY — 31 items
BLADE SURG SZ11 CARB STEEL (BLADE) ×1 IMPLANT
CATH ROBINSON RED A/P 16FR (CATHETERS) ×1 IMPLANT
CHLORAPREP W/TINT 26 (MISCELLANEOUS) ×1 IMPLANT
CLIP FILSHIE TUBAL LIGA STRL (Clip) ×1 IMPLANT
DERMABOND ADVANCED .7 DNX12 (GAUZE/BANDAGES/DRESSINGS) ×1 IMPLANT
GAUZE 4X4 16PLY ~~LOC~~+RFID DBL (SPONGE) ×1 IMPLANT
GLOVE PI ORTHO PRO STRL 7.5 (GLOVE) ×2 IMPLANT
GOWN STRL REUS W/ TWL LRG LVL3 (GOWN DISPOSABLE) ×1 IMPLANT
GOWN STRL REUS W/ TWL XL LVL3 (GOWN DISPOSABLE) ×1 IMPLANT
GOWN STRL REUS W/TWL LRG LVL3 (GOWN DISPOSABLE) ×1
GOWN STRL REUS W/TWL XL LVL3 (GOWN DISPOSABLE) ×1
IRRIGATION STRYKERFLOW (MISCELLANEOUS) IMPLANT
IRRIGATOR STRYKERFLOW (MISCELLANEOUS)
IV LACTATED RINGERS 1000ML (IV SOLUTION) IMPLANT
KIT PINK PAD W/HEAD ARE REST (MISCELLANEOUS) ×1
KIT PINK PAD W/HEAD ARM REST (MISCELLANEOUS) ×1 IMPLANT
KIT TURNOVER CYSTO (KITS) ×1 IMPLANT
MANIFOLD NEPTUNE II (INSTRUMENTS) ×1 IMPLANT
NS IRRIG 500ML POUR BTL (IV SOLUTION) ×1 IMPLANT
PACK GYN LAPAROSCOPIC (MISCELLANEOUS) ×1 IMPLANT
PAD OB MATERNITY 4.3X12.25 (PERSONAL CARE ITEMS) ×1 IMPLANT
PAD PREP 24X41 OB/GYN DISP (PERSONAL CARE ITEMS) ×1 IMPLANT
SET TUBE SMOKE EVAC HIGH FLOW (TUBING) ×1 IMPLANT
SOL PREP PVP 2OZ (MISCELLANEOUS) ×1
SOLUTION PREP PVP 2OZ (MISCELLANEOUS) ×1 IMPLANT
SUT VIC AB 4-0 FS2 27 (SUTURE) ×1 IMPLANT
SUT VICRYL 0 UR6 27IN ABS (SUTURE) ×1 IMPLANT
TRAP FLUID SMOKE EVACUATOR (MISCELLANEOUS) ×1 IMPLANT
TROCAR 12M 150ML BLUNT (TROCAR) IMPLANT
TROCAR XCEL 12X100 BLDLESS (ENDOMECHANICALS) ×1 IMPLANT
WATER STERILE IRR 500ML POUR (IV SOLUTION) ×1 IMPLANT

## 2022-01-21 NOTE — Anesthesia Procedure Notes (Signed)
Procedure Name: Intubation Date/Time: 01/21/2022 5:39 PM  Performed by: Nelda Marseille, CRNAPre-anesthesia Checklist: Patient identified, Patient being monitored, Timeout performed, Emergency Drugs available and Suction available Patient Re-evaluated:Patient Re-evaluated prior to induction Oxygen Delivery Method: Circle system utilized Preoxygenation: Pre-oxygenation with 100% oxygen Induction Type: IV induction Ventilation: Mask ventilation without difficulty Laryngoscope Size: Mac, 3 and McGraph Grade View: Grade II Tube type: Oral Tube size: 7.0 mm Number of attempts: 1 Airway Equipment and Method: Stylet Placement Confirmation: ETT inserted through vocal cords under direct vision, positive ETCO2 and breath sounds checked- equal and bilateral Secured at: 21 cm Tube secured with: Tape Dental Injury: Teeth and Oropharynx as per pre-operative assessment

## 2022-01-21 NOTE — Transfer of Care (Signed)
Immediate Anesthesia Transfer of Care Note  Patient: Yvonne Reid  Procedure(s) Performed: LAPAROSCOPIC BILATERAL TUBAL LIGATION (Bilateral)  Patient Location: PACU  Anesthesia Type:General  Level of Consciousness: awake, alert , and oriented  Airway & Oxygen Therapy: Patient Spontanous Breathing and Patient connected to face mask oxygen  Post-op Assessment: Report given to RN and Post -op Vital signs reviewed and stable  Post vital signs: Reviewed and stable  Last Vitals:  Vitals Value Taken Time  BP 141/68 01/21/22 1824  Temp    Pulse 83 01/21/22 1828  Resp 25 01/21/22 1828  SpO2 96 % 01/21/22 1828  Vitals shown include unvalidated device data.  Last Pain:  Vitals:   01/21/22 1248  TempSrc: Oral  PainSc: 0-No pain         Complications: No notable events documented.

## 2022-01-21 NOTE — Interval H&P Note (Signed)
History and Physical Interval Note:  01/21/2022 5:15 PM  Yvonne Reid  has presented today for surgery, with the diagnosis of desires sterilization.  The various methods of treatment have been discussed with the patient and family. After consideration of risks, benefits and other options for treatment, the patient has consented to  Procedure(s): LAPAROSCOPIC BILATERAL TUBAL LIGATION (Bilateral) as a surgical intervention.  The patient's history has been reviewed, patient examined, no change in status, stable for surgery.  I have reviewed the patient's chart and labs.  Questions were answered to the patient's satisfaction.     Brennan Bailey

## 2022-01-21 NOTE — Anesthesia Preprocedure Evaluation (Addendum)
Anesthesia Evaluation  Patient identified by MRN, date of birth, ID band Patient awake    Reviewed: Allergy & Precautions, NPO status , Patient's Chart, lab work & pertinent test results  History of Anesthesia Complications Negative for: history of anesthetic complications  Airway Mallampati: I   Neck ROM: Full    Dental  (+) Missing Bridge :   Pulmonary asthma , former smoker (quit 04/2021)   Pulmonary exam normal breath sounds clear to auscultation       Cardiovascular hypertension, Normal cardiovascular exam Rhythm:Regular Rate:Normal     Neuro/Psych  Headaches, Seizures - (pseudoseizures),     GI/Hepatic negative GI ROS,,,  Endo/Other  diabetes, Gestational  Obesity   Renal/GU negative Renal ROS     Musculoskeletal   Abdominal   Peds  Hematology negative hematology ROS (+)   Anesthesia Other Findings   Reproductive/Obstetrics (+) Breast feeding                              Anesthesia Physical Anesthesia Plan  ASA: 2  Anesthesia Plan: General   Post-op Pain Management:    Induction: Intravenous  PONV Risk Score and Plan: 3 and Ondansetron, Dexamethasone and Treatment may vary due to age or medical condition  Airway Management Planned: Oral ETT  Additional Equipment:   Intra-op Plan:   Post-operative Plan: Extubation in OR  Informed Consent: I have reviewed the patients History and Physical, chart, labs and discussed the procedure including the risks, benefits and alternatives for the proposed anesthesia with the patient or authorized representative who has indicated his/her understanding and acceptance.     Dental advisory given  Plan Discussed with: CRNA  Anesthesia Plan Comments: (Patient consented for risks of anesthesia including but not limited to:  - adverse reactions to medications - damage to eyes, teeth, lips or other oral mucosa - nerve damage due to  positioning  - sore throat or hoarseness - damage to heart, brain, nerves, lungs, other parts of body or loss of life  Informed patient about role of CRNA in peri- and intra-operative care.  Patient voiced understanding.)        Anesthesia Quick Evaluation

## 2022-01-21 NOTE — Op Note (Signed)
     OPERATIVE NOTE 01/21/2022 5:17 PM  PRE-OPERATIVE DIAGNOSIS:  1) desires sterilization  POST-OPERATIVE DIAGNOSIS:  Same  OPERATION:  Laparoscopic Filshie clip tubal occlusion for sterilization  SURGEON(S): Surgeon(s) and Role:    Linzie Collin, MD - Primary   ANESTHESIA: Choice  ESTIMATED BLOOD LOSS: 10 mL   SPECIMEN: * No specimens in log *  COMPLICATIONS: None  DISPOSITION: Stable to recovery room  DESCRIPTION OF PROCEDURE:      The patient was prepped and draped in the dorsolithotomy position and placed under general anesthesia. The bladder was emptied. The cervix was grasped with a multi-toothed tenaculum and a uterine manipulator was placed within the cervical os respecting the position and curvature of the uterus. After changing gloves we proceeded abdominally. A small infraumbilical incision was made and a 10 mm trocar port was placed within the abdominopelvic cavity. The opening pressure was less than 7 mmHg.  Approximately 3 and 1/2 L of carbon dioxide gas was instilled within the abdominal pelvic cavity. The laparoscope was placed and the pelvis and abdomen were carefully inspected. The right fallopian tube was identified and followed out to its fine fimbriated end and then back approximate half the way. At this point the section of tube was elevated on Filshie clip and completely occluded in a perpendicular manner. The left fallopian tube was similarly identified and again the midportion of the tube is completely occluded using a Filshie clip in a perpendicular manner. Hemostasis of the fallopian tubes as well as the pelvis was noted. The laparoscope was removed the trocar sleeve was removed and the incision was closed with a deep suture through the fascia of 0 Vicryl followed by subcuticular closure of the skin. A long-acting anesthetic was injected.  Dermabond was applied. The uterine manipulator was removed. Hemostasis of the cervix was noted. The patient went  to the recovery room in stable condition.   Elonda Husky, M.D. 01/21/2022 5:17 PM

## 2022-01-21 NOTE — Discharge Instructions (Signed)

## 2022-01-22 ENCOUNTER — Encounter: Payer: Self-pay | Admitting: Obstetrics and Gynecology

## 2022-01-22 NOTE — Anesthesia Postprocedure Evaluation (Signed)
Anesthesia Post Note  Patient: Yvonne Reid  Procedure(s) Performed: LAPAROSCOPIC BILATERAL TUBAL LIGATION (Bilateral)  Patient location during evaluation: PACU Anesthesia Type: General Level of consciousness: awake and alert, oriented and patient cooperative Pain management: pain level controlled Vital Signs Assessment: post-procedure vital signs reviewed and stable Respiratory status: spontaneous breathing, nonlabored ventilation and respiratory function stable Cardiovascular status: blood pressure returned to baseline and stable Postop Assessment: adequate PO intake Anesthetic complications: no   No notable events documented.   Last Vitals:  Vitals:   01/21/22 1925 01/21/22 1930  BP: (!) 156/95   Pulse: 76 74  Resp:    Temp:    SpO2: 96% 96%    Last Pain:  Vitals:   01/21/22 1930  TempSrc:   PainSc: 2                  Reed Breech

## 2022-01-28 ENCOUNTER — Telehealth: Payer: Self-pay | Admitting: Obstetrics and Gynecology

## 2022-01-28 NOTE — Telephone Encounter (Signed)
Form sent, patient made aware.

## 2022-01-28 NOTE — Telephone Encounter (Signed)
Patient is calling to see if we have received a breast pump pump form form [employer].   Cb# [phone]

## 2022-02-05 ENCOUNTER — Ambulatory Visit (INDEPENDENT_AMBULATORY_CARE_PROVIDER_SITE_OTHER): Payer: 59 | Admitting: Obstetrics and Gynecology

## 2022-02-05 ENCOUNTER — Encounter: Payer: Self-pay | Admitting: Obstetrics and Gynecology

## 2022-02-05 ENCOUNTER — Encounter: Payer: 59 | Admitting: Obstetrics and Gynecology

## 2022-02-05 VITALS — BP 108/77 | HR 81 | Wt 172.1 lb

## 2022-02-05 DIAGNOSIS — Z9889 Other specified postprocedural states: Secondary | ICD-10-CM

## 2022-02-05 NOTE — Progress Notes (Signed)
Patient presents for 2 week postop follow-up following BTL. She states  there is some tenderness is some spots.

## 2022-02-05 NOTE — Progress Notes (Signed)
HPI:      [name] is a 36 y.o. [id number] who LMP was No LMP recorded.  Subjective:   She presents today for postop check.  She reports her abdomen is doing well.  She has occasional twinges of pain but nothing significant.  Not taking pain medicine.  She is going to the bathroom and eating without difficulty.  She continues to breast-feed full-time.    Hx: The following portions of the patient's history were reviewed and updated as appropriate:             She  has a past medical history of Anemia, Asthma, Asthma, Gestational diabetes, Headache, Migraines, Pregnancy-induced hypertension, unspecified trimester, and Seizures (HCC). She does not have any pertinent problems on file. She  has a past surgical history that includes Cesarean section (2009); I & D extremity (Right, 08/24/2015); and Laparoscopic tubal ligation (Bilateral, 01/21/2022). Her family history includes Breast cancer in her maternal aunt; Cervical cancer in her sister; Diabetes in her maternal grandfather and maternal grandmother; Heart block in her father. She  reports that she quit smoking about 9 months ago. Her smoking use included cigarettes. She has a 3.50 pack-year smoking history. She has never used smokeless tobacco. She reports that she does not currently use alcohol. She reports that she does not use drugs. She has a current medication list which includes the following prescription(s): albuterol, multivitamin-prenatal, hydrocodone-acetaminophen, and labetalol. She is allergic to sulfa antibiotics.       Review of Systems:  Review of Systems  Constitutional: Denied constitutional symptoms, night sweats, recent illness, fatigue, fever, insomnia and weight loss.  Eyes: Denied eye symptoms, eye pain, photophobia, vision change and visual disturbance.  Ears/Nose/Throat/Neck: Denied ear, nose, throat or neck symptoms, hearing loss, nasal discharge, sinus congestion and sore throat.  Cardiovascular: Denied  cardiovascular symptoms, arrhythmia, chest pain/pressure, edema, exercise intolerance, orthopnea and palpitations.  Respiratory: Denied pulmonary symptoms, asthma, pleuritic pain, productive sputum, cough, dyspnea and wheezing.  Gastrointestinal: Denied, gastro-esophageal reflux, melena, nausea and vomiting.  Genitourinary: Denied genitourinary symptoms including symptomatic vaginal discharge, pelvic relaxation issues, and urinary complaints.  Musculoskeletal: Denied musculoskeletal symptoms, stiffness, swelling, muscle weakness and myalgia.  Dermatologic: Denied dermatology symptoms, rash and scar.  Neurologic: Denied neurology symptoms, dizziness, headache, neck pain and syncope.  Psychiatric: Denied psychiatric symptoms, anxiety and depression.  Endocrine: Denied endocrine symptoms including hot flashes and night sweats.   Meds:   Current Outpatient Medications on File Prior to Visit  Medication Sig Dispense Refill   albuterol (VENTOLIN HFA) 108 (90 Base) MCG/ACT inhaler Inhale 1-2 puffs into the lungs every 6 (six) hours as needed for wheezing or shortness of breath. 1 each 0   Prenatal Vit-Fe Fumarate-FA (MULTIVITAMIN-PRENATAL) 27-0.8 MG TABS tablet Take 1 tablet by mouth daily at 12 noon.     HYDROcodone-acetaminophen (NORCO/VICODIN) 5-325 MG tablet Take 1-2 tablets by mouth every 6 (six) hours as needed for moderate pain. 15 tablet 0   labetalol (NORMODYNE) 100 MG tablet Take 1 tablet (100 mg total) by mouth 2 (two) times daily. (Patient taking differently: Take 100 mg by mouth 2 (two) times daily. Last dose of Labetalol will be on 01-20-22 as instructed by Dr Logan Bores in hopes of not having to be on bp meds after surgery(htn was pregnancy induced)) 60 tablet 6   No current facility-administered medications on file prior to visit.      Objective:     Vitals:   02/05/22 1047 02/05/22 1105  BP: (!) 143/77  108/77  Pulse: 89 81   Filed Weights   02/05/22 1047  Weight: 172 lb 1.6 oz  (78.1 kg)               Abdomen: Soft.  Non-tender.  No masses.  No HSM.  Incision/s: Intact.  Healing well.  No erythema.  No drainage.             Assessment:    [id number] Patient Active Problem List   Diagnosis Date Noted   VBAC, delivered, current hospitalization 12/10/2021   Postpartum care following vaginal delivery 12/10/2021   Encounter for care or examination of lactating mother 12/10/2021   Gestational hypertension affecting third pregnancy 12/10/2021   Supervision of high risk pregnancy in third trimester 10/26/2021   Unwanted fertility 10/26/2021   History of cesarean delivery 07/04/2021   History of placenta abruption 07/04/2021   History of asthma 08/18/2020     1. Postoperative state     Excellent recovery postop.  No issues   Plan:            1.  Patient may return to work in January as requested. Orders No orders of the defined types were placed in this encounter.   No orders of the defined types were placed in this encounter.     F/U  No follow-ups on file.  Elonda Husky, M.D. 02/05/2022 11:26 AM

## 2022-05-08 ENCOUNTER — Ambulatory Visit: Payer: 59 | Admitting: Obstetrics and Gynecology

## 2022-05-08 ENCOUNTER — Encounter: Payer: Self-pay | Admitting: Obstetrics and Gynecology

## 2022-05-08 VITALS — BP 144/84 | HR 108 | Ht 61.0 in | Wt 196.1 lb

## 2022-05-08 DIAGNOSIS — Z4889 Encounter for other specified surgical aftercare: Secondary | ICD-10-CM | POA: Diagnosis not present

## 2022-05-08 DIAGNOSIS — Z9889 Other specified postprocedural states: Secondary | ICD-10-CM

## 2022-05-08 DIAGNOSIS — F419 Anxiety disorder, unspecified: Secondary | ICD-10-CM

## 2022-05-08 DIAGNOSIS — N912 Amenorrhea, unspecified: Secondary | ICD-10-CM

## 2022-05-08 MED ORDER — SERTRALINE HCL 50 MG PO TABS: 50.0000 mg | ORAL_TABLET | Freq: Every day | ORAL | 0 refills | Status: DC

## 2022-05-08 NOTE — Progress Notes (Signed)
HPI:      [name] is a 37 y.o. [id number] who LMP was No LMP recorded.  Subjective:   She presents today stating that she is pumping 5-7 times per day.  She has not yet begun her menses. (Recent tubal ligation for birth control).  She reports that she has noticed significant anxiety since returning to work.  This is reflected in her GAD scores below.  She says that she had anxiety many years ago but she has not had it for a while.   She also states that she has begun having what she interprets as hot flashes-especially night sweats.    Hx: The following portions of the patient's history were reviewed and updated as appropriate:             She  has a past medical history of Anemia, Asthma, Asthma, Gestational diabetes, Headache, Migraines, Pregnancy-induced hypertension, unspecified trimester, and Seizures ([place]). She does not have any pertinent problems on file. She  has a past surgical history that includes Cesarean section (2009); I & D extremity (Right, 08/24/2015); and Laparoscopic tubal ligation (Bilateral, 01/21/2022). Her family history includes Breast cancer in her maternal aunt; Cervical cancer in her sister; Diabetes in her maternal grandfather and maternal grandmother; Heart block in her father. She  reports that she quit smoking about 12 months ago. Her smoking use included cigarettes. She has a 3.50 pack-year smoking history. She has never used smokeless tobacco. She reports that she does not currently use alcohol. She reports that she does not use drugs. She has a current medication list which includes the following prescription(s): albuterol, multivitamin-prenatal, and sertraline. She is allergic to sulfa antibiotics.       Review of Systems:  Review of Systems  Constitutional: Denied constitutional symptoms, night sweats, recent illness, fatigue, fever, insomnia and weight loss.  Eyes: Denied eye symptoms, eye pain, photophobia, vision change and visual disturbance.   Ears/Nose/Throat/Neck: Denied ear, nose, throat or neck symptoms, hearing loss, nasal discharge, sinus congestion and sore throat.  Cardiovascular: Denied cardiovascular symptoms, arrhythmia, chest pain/pressure, edema, exercise intolerance, orthopnea and palpitations.  Respiratory: Denied pulmonary symptoms, asthma, pleuritic pain, productive sputum, cough, dyspnea and wheezing.  Gastrointestinal: Denied, gastro-esophageal reflux, melena, nausea and vomiting.  Genitourinary: Denied genitourinary symptoms including symptomatic vaginal discharge, pelvic relaxation issues, and urinary complaints.  Musculoskeletal: Denied musculoskeletal symptoms, stiffness, swelling, muscle weakness and myalgia.  Dermatologic: Denied dermatology symptoms, rash and scar.  Neurologic: Denied neurology symptoms, dizziness, headache, neck pain and syncope.  Psychiatric: See HPI for additional information.  Endocrine: Denied endocrine symptoms including hot flashes and night sweats.   Meds:   Current Outpatient Medications on File Prior to Visit  Medication Sig Dispense Refill   albuterol (VENTOLIN HFA) 108 (90 Base) MCG/ACT inhaler Inhale 1-2 puffs into the lungs every 6 (six) hours as needed for wheezing or shortness of breath. 1 each 0   Prenatal Vit-Fe Fumarate-FA (MULTIVITAMIN-PRENATAL) 27-0.8 MG TABS tablet Take 1 tablet by mouth daily at 12 noon.     No current facility-administered medications on file prior to visit.      Objective:     Vitals:   05/08/22 0840  BP: (!) 144/84  Pulse: (!) 108   Filed Weights   05/08/22 0840  Weight: 196 lb 1.6 oz (89 kg)                        Assessment:    [id number] Patient Active Problem  List   Diagnosis Date Noted   VBAC, delivered, current hospitalization 12/10/2021   Postpartum care following vaginal delivery 12/10/2021   Encounter for care or examination of lactating mother 12/10/2021   Gestational hypertension affecting third pregnancy  12/10/2021   Supervision of high risk pregnancy in third trimester 10/26/2021   Unwanted fertility 10/26/2021   History of cesarean delivery 07/04/2021   History of placenta abruption 07/04/2021   History of asthma 08/18/2020     1. Postoperative state   2. Anxiety   3. Amenorrhea     Mood changes reflected in anxiety and other menopausal symptoms with possible hot flashes and night sweats.   Plan:            1.  Discussed use of Zoloft for anxiety.  2.  FSH, TSH  3.  Patient to check in in 3 weeks with video visit for follow-up. Orders Orders Placed This Encounter  Procedures   Follicle stimulating hormone   TSH     Meds ordered this encounter  Medications   sertraline (ZOLOFT) 50 MG tablet    Sig: Take 1 tablet (50 mg total) by mouth daily.    Dispense:  90 tablet    Refill:  0      F/U  Return in about 3 weeks (around 05/29/2022). I spent 22 minutes involved in the care of this patient preparing to see the patient by obtaining and reviewing her medical history (including labs, imaging tests and prior procedures), documenting clinical information in the electronic health record (EHR), counseling and coordinating care plans, writing and sending prescriptions, ordering tests or procedures and in direct communicating with the patient and medical staff discussing pertinent items from her history and physical exam.  Finis Bud, M.D. 05/08/2022 8:59 AM

## 2022-05-08 NOTE — Progress Notes (Signed)
Patient presents today to follow-up from her BTL. She also states feeling some anxiety lately after returning to work. GAD 16.  PHQ 12.

## 2022-05-09 LAB — TSH: TSH: 1.57 u[IU]/mL (ref 0.450–4.500)

## 2022-05-09 LAB — FOLLICLE STIMULATING HORMONE: FSH: 5.8 m[IU]/mL

## 2022-05-29 ENCOUNTER — Telehealth: Payer: 59 | Admitting: Obstetrics and Gynecology

## 2022-05-29 ENCOUNTER — Encounter: Payer: Self-pay | Admitting: Obstetrics and Gynecology

## 2022-05-29 DIAGNOSIS — F419 Anxiety disorder, unspecified: Secondary | ICD-10-CM | POA: Diagnosis not present

## 2022-05-29 NOTE — Progress Notes (Signed)
Virtual Visit via Video Note  I connected with Yvonne Reid on 05/29/22 at  7:45 AM EDT by video and verified that I was speaking with the correct person using two identifiers.    Yvonne Reid is a 37 y.o. [id number] who LMP was No LMP recorded. I discussed the limitations, risks, security and privacy concerns of performing an evaluation and management service by video and the availability of in person appointments. I also discussed with the patient that there may be a patient responsible charge related to this service. The patient expressed understanding and agreed to proceed.  Location of patient: Work  Patient gave explicit verbal consent for video visit:  YES  Location of provider:  [facility] office  Persons other than physician and patient involved in provider conference:  None   Subjective:   History of Present Illness:    She was having significant anxiety since returning to work postpartum.  She was begun on Zoloft at her last visit.  She now reports that she is doing well and has made a big difference in her life. In addition she has changed work positions to take a job less stressful and closer to home.  This is also contributed to her overall improvement in anxiety levels.  Hx: The following portions of the patient's history were reviewed and updated as appropriate:             She  has a past medical history of Anemia, Asthma, Asthma, Gestational diabetes, Headache, Migraines, Pregnancy-induced hypertension, unspecified trimester, and Seizures ([place]). She does not have any pertinent problems on file. She  has a past surgical history that includes Cesarean section (2009); I & D extremity (Right, 08/24/2015); and Laparoscopic tubal ligation (Bilateral, 01/21/2022). Her family history includes Breast cancer in her maternal aunt; Cervical cancer in her sister; Diabetes in her maternal grandfather and maternal grandmother; Heart block in her father. She  reports that she quit  smoking about 13 months ago. Her smoking use included cigarettes. She has a 3.50 pack-year smoking history. She has never used smokeless tobacco. She reports that she does not currently use alcohol. She reports that she does not use drugs. She has a current medication list which includes the following prescription(s): albuterol, multivitamin-prenatal, and sertraline. She is allergic to sulfa antibiotics.       Review of Systems:  Review of Systems  Constitutional: Denied constitutional symptoms, night sweats, recent illness, fatigue, fever, insomnia and weight loss.  Eyes: Denied eye symptoms, eye pain, photophobia, vision change and visual disturbance.  Ears/Nose/Throat/Neck: Denied ear, nose, throat or neck symptoms, hearing loss, nasal discharge, sinus congestion and sore throat.  Cardiovascular: Denied cardiovascular symptoms, arrhythmia, chest pain/pressure, edema, exercise intolerance, orthopnea and palpitations.  Respiratory: Denied pulmonary symptoms, asthma, pleuritic pain, productive sputum, cough, dyspnea and wheezing.  Gastrointestinal: Denied, gastro-esophageal reflux, melena, nausea and vomiting.  Genitourinary: Denied genitourinary symptoms including symptomatic vaginal discharge, pelvic relaxation issues, and urinary complaints.  Musculoskeletal: Denied musculoskeletal symptoms, stiffness, swelling, muscle weakness and myalgia.  Dermatologic: Denied dermatology symptoms, rash and scar.  Neurologic: Denied neurology symptoms, dizziness, headache, neck pain and syncope.  Psychiatric: Denied psychiatric symptoms, anxiety and depression.  Endocrine: Denied endocrine symptoms including hot flashes and night sweats.   Meds:   Current Outpatient Medications on File Prior to Visit  Medication Sig Dispense Refill   albuterol (VENTOLIN HFA) 108 (90 Base) MCG/ACT inhaler Inhale 1-2 puffs into the lungs every 6 (six) hours as needed for wheezing or shortness  of breath. 1 each 0    Prenatal Vit-Fe Fumarate-FA (MULTIVITAMIN-PRENATAL) 27-0.8 MG TABS tablet Take 1 tablet by mouth daily at 12 noon.     sertraline (ZOLOFT) 50 MG tablet Take 1 tablet (50 mg total) by mouth daily. 90 tablet 0   No current facility-administered medications on file prior to visit.    Assessment:    [id number] Patient Active Problem List   Diagnosis Date Noted   VBAC, delivered, current hospitalization 12/10/2021   Postpartum care following vaginal delivery 12/10/2021   Encounter for care or examination of lactating mother 12/10/2021   Gestational hypertension affecting third pregnancy 12/10/2021   Supervision of high risk pregnancy in third trimester 10/26/2021   Unwanted fertility 10/26/2021   History of cesarean delivery 07/04/2021   History of placenta abruption 07/04/2021   History of asthma 08/18/2020     1. Anxiety     Much improved on Zoloft.  Plan:            1.  Continue Zoloft.  Orders No orders of the defined types were placed in this encounter.   No orders of the defined types were placed in this encounter.     F/U  Return in about 3 months (around 08/29/2022). I spent 22 minutes involved in the care of this patient preparing to see the patient by obtaining and reviewing her medical history (including labs, imaging tests and prior procedures), documenting clinical information in the electronic health record (EHR), counseling and coordinating care plans, writing and sending prescriptions, ordering tests or procedures and in direct communicating with the patient and medical staff discussing pertinent items from her history and physical exam.   Finis Bud, M.D. 05/29/2022 8:22 AM

## 2022-08-03 ENCOUNTER — Other Ambulatory Visit: Payer: Self-pay | Admitting: Obstetrics and Gynecology

## 2023-06-16 ENCOUNTER — Other Ambulatory Visit: Payer: Self-pay

## 2023-06-16 ENCOUNTER — Encounter: Payer: Self-pay | Admitting: Intensive Care

## 2023-06-16 ENCOUNTER — Observation Stay

## 2023-06-16 ENCOUNTER — Emergency Department

## 2023-06-16 ENCOUNTER — Encounter
Admission: EM | Disposition: A | Discharge status: Home / Self Care | Payer: Self-pay | Source: Home / Self Care | Attending: Emergency Medicine

## 2023-06-16 ENCOUNTER — Observation Stay
Admission: EM | Admit: 2023-06-16 | Discharge: 2023-06-16 | Disposition: A | Discharge status: Home / Self Care | Attending: Emergency Medicine | Admitting: Emergency Medicine

## 2023-06-16 DIAGNOSIS — Z87891 Personal history of nicotine dependence: Secondary | ICD-10-CM | POA: Diagnosis not present

## 2023-06-16 DIAGNOSIS — K819 Cholecystitis, unspecified: Principal | ICD-10-CM

## 2023-06-16 DIAGNOSIS — J45909 Unspecified asthma, uncomplicated: Secondary | ICD-10-CM | POA: Diagnosis not present

## 2023-06-16 DIAGNOSIS — Z79899 Other long term (current) drug therapy: Secondary | ICD-10-CM | POA: Diagnosis not present

## 2023-06-16 DIAGNOSIS — K8012 Calculus of gallbladder with acute and chronic cholecystitis without obstruction: Secondary | ICD-10-CM | POA: Diagnosis not present

## 2023-06-16 DIAGNOSIS — K810 Acute cholecystitis: Secondary | ICD-10-CM | POA: Diagnosis present

## 2023-06-16 DIAGNOSIS — R1011 Right upper quadrant pain: Secondary | ICD-10-CM | POA: Diagnosis present

## 2023-06-16 LAB — CBC
HCT: 41.2 % (ref 36.0–46.0)
Hemoglobin: 13.5 g/dL (ref 12.0–15.0)
MCH: 26.5 pg (ref 26.0–34.0)
MCHC: 32.8 g/dL (ref 30.0–36.0)
MCV: 80.8 fL (ref 80.0–100.0)
Platelets: 349 10*3/uL (ref 150–400)
RBC: 5.1 MIL/uL (ref 3.87–5.11)
RDW: 13.8 % (ref 11.5–15.5)
WBC: 12.1 10*3/uL — ABNORMAL HIGH (ref 4.0–10.5)
nRBC: 0 % (ref 0.0–0.2)

## 2023-06-16 LAB — COMPREHENSIVE METABOLIC PANEL WITH GFR
ALT: 13 U/L (ref 0–44)
AST: 16 U/L (ref 15–41)
Albumin: 3.9 g/dL (ref 3.5–5.0)
Alkaline Phosphatase: 70 U/L (ref 38–126)
Anion gap: 10 (ref 5–15)
BUN: 7 mg/dL (ref 6–20)
CO2: 20 mmol/L — ABNORMAL LOW (ref 22–32)
Calcium: 8.8 mg/dL — ABNORMAL LOW (ref 8.9–10.3)
Chloride: 106 mmol/L (ref 98–111)
Creatinine, Ser: 0.61 mg/dL (ref 0.44–1.00)
GFR, Estimated: >60 mL/min (ref >60)
Glucose, Bld: 92 mg/dL (ref 70–99)
Potassium: 3.8 mmol/L (ref 3.5–5.1)
Sodium: 136 mmol/L (ref 135–145)
Total Bilirubin: 0.4 mg/dL (ref 0.0–1.2)
Total Protein: 6.7 g/dL (ref 6.5–8.1)

## 2023-06-16 LAB — TROPONIN I (HIGH SENSITIVITY): Troponin I (High Sensitivity): 2 ng/L

## 2023-06-16 LAB — LIPASE, BLOOD: Lipase: 30 U/L (ref 11–51)

## 2023-06-16 LAB — PREGNANCY, URINE: Preg Test, Ur: NEGATIVE

## 2023-06-16 SURGERY — CHOLECYSTECTOMY, ROBOT-ASSISTED, LAPAROSCOPIC
Anesthesia: General | Site: Abdomen

## 2023-06-16 MED ORDER — 0.9 % SODIUM CHLORIDE (POUR BTL) OPTIME
TOPICAL | Status: DC | PRN
  Administered 2023-06-16: 500 mL

## 2023-06-16 MED ORDER — PROPOFOL 10 MG/ML IV BOLUS
INTRAVENOUS | Status: DC | PRN
  Administered 2023-06-16: 150 mg via INTRAVENOUS

## 2023-06-16 MED ORDER — ROCURONIUM BROMIDE 100 MG/10ML IV SOLN
INTRAVENOUS | Status: DC | PRN
  Administered 2023-06-16: 10 mg via INTRAVENOUS
  Administered 2023-06-16: 50 mg via INTRAVENOUS
  Administered 2023-06-16: 10 mg via INTRAVENOUS

## 2023-06-16 MED ORDER — MORPHINE SULFATE (PF) 4 MG/ML IV SOLN
4.0000 mg | Freq: Once | INTRAVENOUS | Status: AC
  Administered 2023-06-16: 4 mg via INTRAVENOUS
  Filled 2023-06-16: qty 1

## 2023-06-16 MED ORDER — PIPERACILLIN-TAZOBACTAM 3.375 G IVPB
3.3750 g | Freq: Once | INTRAVENOUS | Status: AC
  Administered 2023-06-16: 3.375 g via INTRAVENOUS
  Filled 2023-06-16: qty 50

## 2023-06-16 MED ORDER — VASOPRESSIN 20 UNIT/ML IV SOLN
INTRAVENOUS | Status: DC | PRN
  Administered 2023-06-16: 2 [IU] via INTRAVENOUS
  Administered 2023-06-16: 1 [IU] via INTRAVENOUS
  Administered 2023-06-16: 2 [IU] via INTRAVENOUS

## 2023-06-16 MED ORDER — ONDANSETRON HCL 4 MG/2ML IJ SOLN
INTRAMUSCULAR | Status: AC
  Filled 2023-06-16: qty 2

## 2023-06-16 MED ORDER — PHENYLEPHRINE HCL-NACL 20-0.9 MG/250ML-% IV SOLN
INTRAVENOUS | Status: AC
  Filled 2023-06-16: qty 250

## 2023-06-16 MED ORDER — OXYCODONE HCL 5 MG PO TABS
5.0000 mg | ORAL_TABLET | Freq: Once | ORAL | Status: AC
  Administered 2023-06-16: 5 mg via ORAL

## 2023-06-16 MED ORDER — PHENYLEPHRINE 80 MCG/ML (10ML) SYRINGE FOR IV PUSH (FOR BLOOD PRESSURE SUPPORT)
PREFILLED_SYRINGE | INTRAVENOUS | Status: DC | PRN
  Administered 2023-06-16 (×2): 80 ug via INTRAVENOUS
  Administered 2023-06-16: 160 ug via INTRAVENOUS
  Administered 2023-06-16 (×2): 80 ug via INTRAVENOUS
  Administered 2023-06-16: 160 ug via INTRAVENOUS
  Administered 2023-06-16: 80 ug via INTRAVENOUS

## 2023-06-16 MED ORDER — LIDOCAINE VISCOUS HCL 2 % MT SOLN
15.0000 mL | Freq: Once | OROMUCOSAL | Status: AC
  Administered 2023-06-16: 15 mL via ORAL
  Filled 2023-06-16: qty 15

## 2023-06-16 MED ORDER — ALUM & MAG HYDROXIDE-SIMETH 200-200-20 MG/5ML PO SUSP
30.0000 mL | Freq: Once | ORAL | Status: AC
  Administered 2023-06-16: 30 mL via ORAL
  Filled 2023-06-16: qty 30

## 2023-06-16 MED ORDER — BUPIVACAINE-EPINEPHRINE (PF) 0.25% -1:200000 IJ SOLN
INTRAMUSCULAR | Status: AC
  Filled 2023-06-16: qty 30

## 2023-06-16 MED ORDER — DEXAMETHASONE SODIUM PHOSPHATE 10 MG/ML IJ SOLN
INTRAMUSCULAR | Status: AC
Start: 2023-06-16 — End: ?
  Filled 2023-06-16: qty 1

## 2023-06-16 MED ORDER — MORPHINE SULFATE (PF) 4 MG/ML IV SOLN: 4.0000 mg | INTRAVENOUS | Status: DC | PRN

## 2023-06-16 MED ORDER — FENTANYL CITRATE (PF) 100 MCG/2ML IJ SOLN: 25.0000 ug | INTRAMUSCULAR | Status: DC | PRN

## 2023-06-16 MED ORDER — INDOCYANINE GREEN 25 MG IV SOLR
INTRAVENOUS | Status: AC
  Filled 2023-06-16: qty 10

## 2023-06-16 MED ORDER — BUPIVACAINE-EPINEPHRINE 0.25% -1:200000 IJ SOLN
INTRAMUSCULAR | Status: DC | PRN
  Administered 2023-06-16: 20 mL

## 2023-06-16 MED ORDER — ONDANSETRON 4 MG PO TBDP: 4.0000 mg | ORAL_TABLET | Freq: Four times a day (QID) | ORAL | Status: DC | PRN

## 2023-06-16 MED ORDER — ONDANSETRON HCL 4 MG/2ML IJ SOLN: 4.0000 mg | Freq: Four times a day (QID) | INTRAMUSCULAR | Status: DC | PRN

## 2023-06-16 MED ORDER — OXYCODONE HCL 5 MG PO TABS
ORAL_TABLET | ORAL | Status: AC
  Filled 2023-06-16: qty 1

## 2023-06-16 MED ORDER — KETOROLAC TROMETHAMINE 30 MG/ML IJ SOLN
INTRAMUSCULAR | Status: AC
  Filled 2023-06-16: qty 1

## 2023-06-16 MED ORDER — SUGAMMADEX SODIUM 200 MG/2ML IV SOLN
INTRAVENOUS | Status: AC
  Filled 2023-06-16: qty 2

## 2023-06-16 MED ORDER — VASOPRESSIN 20 UNIT/ML IV SOLN
INTRAVENOUS | Status: AC
  Filled 2023-06-16: qty 1

## 2023-06-16 MED ORDER — LIDOCAINE HCL (PF) 2 % IJ SOLN
INTRAMUSCULAR | Status: AC
  Filled 2023-06-16: qty 5

## 2023-06-16 MED ORDER — ONDANSETRON HCL 4 MG/2ML IJ SOLN
INTRAMUSCULAR | Status: DC | PRN
  Administered 2023-06-16: 4 mg via INTRAVENOUS

## 2023-06-16 MED ORDER — ACETAMINOPHEN 325 MG PO TABS: 650.0000 mg | ORAL_TABLET | Freq: Four times a day (QID) | ORAL | Status: DC | PRN

## 2023-06-16 MED ORDER — MIDAZOLAM HCL 2 MG/2ML IJ SOLN
INTRAMUSCULAR | Status: DC | PRN
  Administered 2023-06-16: 2 mg via INTRAVENOUS

## 2023-06-16 MED ORDER — HYDROCODONE-ACETAMINOPHEN 5-325 MG PO TABS: 1.0000 | ORAL_TABLET | ORAL | Status: DC | PRN

## 2023-06-16 MED ORDER — ENOXAPARIN SODIUM 40 MG/0.4ML IJ SOSY: 40.0000 mg | PREFILLED_SYRINGE | INTRAMUSCULAR | Status: DC

## 2023-06-16 MED ORDER — PHENYLEPHRINE HCL (PRESSORS) 10 MG/ML IV SOLN: INTRAVENOUS | Status: DC | PRN

## 2023-06-16 MED ORDER — FENTANYL CITRATE (PF) 100 MCG/2ML IJ SOLN
INTRAMUSCULAR | Status: AC
  Filled 2023-06-16: qty 2

## 2023-06-16 MED ORDER — LACTATED RINGERS IV SOLN: INTRAVENOUS | Status: DC | PRN

## 2023-06-16 MED ORDER — PHENYLEPHRINE HCL-NACL 20-0.9 MG/250ML-% IV SOLN
INTRAVENOUS | Status: DC | PRN
Start: 2023-06-16 — End: 2023-06-16
  Administered 2023-06-16: 20 ug/min via INTRAVENOUS

## 2023-06-16 MED ORDER — LIDOCAINE HCL (CARDIAC) PF 100 MG/5ML IV SOSY
PREFILLED_SYRINGE | INTRAVENOUS | Status: DC | PRN
Start: 2023-06-16 — End: 2023-06-16
  Administered 2023-06-16: 60 mg via INTRAVENOUS

## 2023-06-16 MED ORDER — FENTANYL CITRATE (PF) 100 MCG/2ML IJ SOLN
INTRAMUSCULAR | Status: DC | PRN
  Administered 2023-06-16 (×2): 50 ug via INTRAVENOUS

## 2023-06-16 MED ORDER — INDOCYANINE GREEN 25 MG IV SOLR
1.2500 mg | Freq: Once | INTRAVENOUS | Status: AC
  Administered 2023-06-16: 1.25 mg via INTRAVENOUS

## 2023-06-16 MED ORDER — DROPERIDOL 2.5 MG/ML IJ SOLN: 0.6250 mg | Freq: Once | INTRAMUSCULAR | Status: DC | PRN

## 2023-06-16 MED ORDER — DIPHENHYDRAMINE HCL 50 MG/ML IJ SOLN
INTRAMUSCULAR | Status: DC | PRN
  Administered 2023-06-16: 12.5 mg via INTRAVENOUS

## 2023-06-16 MED ORDER — DEXAMETHASONE SODIUM PHOSPHATE 10 MG/ML IJ SOLN
INTRAMUSCULAR | Status: DC | PRN
  Administered 2023-06-16: 4 mg via INTRAVENOUS

## 2023-06-16 MED ORDER — HYDROCODONE-ACETAMINOPHEN 5-325 MG PO TABS: 1.0000 | ORAL_TABLET | Freq: Four times a day (QID) | ORAL | 0 refills | Status: AC | PRN

## 2023-06-16 MED ORDER — ACETAMINOPHEN 325 MG RE SUPP: 650.0000 mg | Freq: Four times a day (QID) | RECTAL | Status: DC | PRN

## 2023-06-16 MED ORDER — MIDAZOLAM HCL 2 MG/2ML IJ SOLN
INTRAMUSCULAR | Status: AC
  Filled 2023-06-16: qty 2

## 2023-06-16 MED ORDER — KETOROLAC TROMETHAMINE 30 MG/ML IJ SOLN
INTRAMUSCULAR | Status: DC | PRN
Start: 2023-06-16 — End: 2023-06-16
  Administered 2023-06-16: 30 mg via INTRAVENOUS

## 2023-06-16 MED ORDER — PROPOFOL 10 MG/ML IV BOLUS
INTRAVENOUS | Status: AC
  Filled 2023-06-16: qty 20

## 2023-06-16 MED ORDER — SUGAMMADEX SODIUM 200 MG/2ML IV SOLN
INTRAVENOUS | Status: DC | PRN
  Administered 2023-06-16: 200 mg via INTRAVENOUS

## 2023-06-16 MED ORDER — SUCCINYLCHOLINE CHLORIDE 200 MG/10ML IV SOSY
PREFILLED_SYRINGE | INTRAVENOUS | Status: DC | PRN
  Administered 2023-06-16: 120 mg via INTRAVENOUS

## 2023-06-16 MED ORDER — PIPERACILLIN-TAZOBACTAM 3.375 G IVPB: 3.3750 g | Freq: Three times a day (TID) | INTRAVENOUS | Status: DC

## 2023-06-16 SURGICAL SUPPLY — 49 items
APPLIER CLIP 32.77 55 M/L DVNC (INSTRUMENTS) ×1 IMPLANT
BAG PRESSURE INF REUSE 1000 (BAG) IMPLANT
CANNULA REDUCER 12-8 DVNC XI (CANNULA) ×1 IMPLANT
CATH REDDICK CHOLANGI 4FR 50CM (CATHETERS) IMPLANT
CAUTERY HOOK MNPLR 1.6 DVNC XI (INSTRUMENTS) ×1 IMPLANT
CLIP APPLIE 32.77 55 M/L DVNC (INSTRUMENTS) IMPLANT
CLIP LIGATING HEM O LOK PURPLE (MISCELLANEOUS) IMPLANT
CLIP LIGATING HEMO O LOK GREEN (MISCELLANEOUS) ×1 IMPLANT
DERMABOND ADVANCED .7 DNX12 (GAUZE/BANDAGES/DRESSINGS) ×1 IMPLANT
DRAPE ARM DVNC X/XI (DISPOSABLE) ×4 IMPLANT
DRAPE C-ARM XRAY 36X54 (DRAPES) IMPLANT
DRAPE COLUMN DVNC XI (DISPOSABLE) ×1 IMPLANT
ELECT REM PT RETURN 9FT ADLT (ELECTROSURGICAL) ×1 IMPLANT
ELECTRODE REM PT RTRN 9FT ADLT (ELECTROSURGICAL) ×1 IMPLANT
FORCEPS BPLR FENES DVNC XI (FORCEP) ×1 IMPLANT
FORCEPS PROGRASP DVNC XI (FORCEP) ×1 IMPLANT
GLOVE BIO SURGEON STRL SZ 6.5 (GLOVE) ×2 IMPLANT
GLOVE BIOGEL PI IND STRL 6.5 (GLOVE) ×2 IMPLANT
GOWN STRL REUS W/ TWL LRG LVL3 (GOWN DISPOSABLE) ×3 IMPLANT
GRASPER SUT TROCAR 14GX15 (MISCELLANEOUS) ×1 IMPLANT
IRRIGATOR SUCT 8 DISP DVNC XI (IRRIGATION / IRRIGATOR) IMPLANT
IV CATH ANGIO 12GX3 LT BLUE (NEEDLE) IMPLANT
IV NS 1000ML BAXH (IV SOLUTION) IMPLANT
KIT PINK PAD W/HEAD ARE REST (MISCELLANEOUS) ×1 IMPLANT
KIT PINK PAD W/HEAD ARM REST (MISCELLANEOUS) ×1 IMPLANT
LABEL OR SOLS (LABEL) ×1 IMPLANT
MANIFOLD NEPTUNE II (INSTRUMENTS) ×1 IMPLANT
NDL HYPO 22X1.5 SAFETY MO (MISCELLANEOUS) ×1 IMPLANT
NDL INSUFFLATION 14GA 120MM (NEEDLE) ×1 IMPLANT
NEEDLE HYPO 22X1.5 SAFETY MO (MISCELLANEOUS) ×1 IMPLANT
NEEDLE INSUFFLATION 14GA 120MM (NEEDLE) ×1 IMPLANT
NS IRRIG 500ML POUR BTL (IV SOLUTION) ×1 IMPLANT
OBTURATOR OPTICAL LONG 8 DVNC (TROCAR) IMPLANT
OBTURATOR OPTICAL STND 8 DVNC (TROCAR) ×1 IMPLANT
OBTURATOR OPTICALSTD 8 DVNC (TROCAR) ×1 IMPLANT
PACK LAP CHOLECYSTECTOMY (MISCELLANEOUS) ×1 IMPLANT
SEAL UNIV 5-12 XI (MISCELLANEOUS) ×4 IMPLANT
SET TUBE SMOKE EVAC HIGH FLOW (TUBING) ×1 IMPLANT
SOL ELECTROSURG ANTI STICK (MISCELLANEOUS) ×1 IMPLANT
SOLUTION ELECTROSURG ANTI STCK (MISCELLANEOUS) ×1 IMPLANT
SPIKE FLUID TRANSFER (MISCELLANEOUS) ×2 IMPLANT
SPONGE T-LAP 4X18 ~~LOC~~+RFID (SPONGE) IMPLANT
SUT MNCRL 4-0 27 PS-2 XMFL (SUTURE) ×1 IMPLANT
SUT VICRYL 0 UR6 27IN ABS (SUTURE) ×1 IMPLANT
SUTURE MNCRL 4-0 27XMF (SUTURE) ×1 IMPLANT
SYS BAG RETRIEVAL 10MM (BASKET) ×1 IMPLANT
SYSTEM BAG RETRIEVAL 10MM (BASKET) ×1 IMPLANT
TRAP FLUID SMOKE EVACUATOR (MISCELLANEOUS) ×1 IMPLANT
WATER STERILE IRR 500ML POUR (IV SOLUTION) ×1 IMPLANT

## 2023-06-16 NOTE — Transfer of Care (Signed)
 Immediate Anesthesia Transfer of Care Note  Patient: Yvonne Reid  Procedure(s) Performed: CHOLECYSTECTOMY, ROBOT-ASSISTED, LAPAROSCOPIC (Abdomen)  Patient Location: PACU  Anesthesia Type:General  Level of Consciousness: awake, drowsy, and patient cooperative  Airway & Oxygen Therapy: Patient connected to face mask oxygen  Post-op Assessment: Report given to RN and Post -op Vital signs reviewed and stable  Post vital signs: Reviewed and stable  Last Vitals:  Vitals Value Taken Time  BP 129/69 06/16/23 1553  Temp 36.9 C 06/16/23 1553  Pulse 96 06/16/23 1557  Resp 24 06/16/23 1557  SpO2 94 % 06/16/23 1557  Vitals shown include unfiled device data.  Last Pain:  Vitals:   06/16/23 1553  TempSrc:   PainSc: Asleep      Pt transported to PACU with circ RN and report given to PACU RN. Patent airway to PACU with 6L O2 via FM. Pt awake, responsive and appears comfortable. VSS.     Complications: No notable events documented.

## 2023-06-16 NOTE — Discharge Summary (Signed)
  Patient ID: Yvonne Reid MRN: [id number] DOB/AGE: 1985-09-24 37 y.o.  Admit date: 06/16/2023 Discharge date: 06/16/2023   Discharge Diagnoses:  Principal Problem:   Acute cholecystitis   Procedures: Robotic assisted laparoscopic cholecystectomy  Hospital Course: Patient admitted from ED with acute cholecystitis. She underwent cholecystectomy. Tolerated procedure well. Pain controlled. Tolerated oral intake. No sign of complications.   Physical Exam Vitals reviewed.  HENT:     Head: Normocephalic.  Cardiovascular:     Rate and Rhythm: Normal rate and regular rhythm.  Pulmonary:     Effort: Pulmonary effort is normal. No respiratory distress.     Breath sounds: Normal breath sounds.  Abdominal:     General: Abdomen is flat. Bowel sounds are normal. There is no distension.     Palpations: Abdomen is soft.  Skin:    General: Skin is warm.     Capillary Refill: Capillary refill takes less than 2 seconds.  Neurological:     General: No focal deficit present.     Mental Status: She is alert and oriented to person, place, and time.      Consults: None   Disposition: Discharge disposition: 01-Home or Self Care       Discharge Instructions     Diet - low sodium heart healthy   Complete by: As directed    Increase activity slowly   Complete by: As directed       Allergies as of 06/16/2023       Reactions   Sulfa Antibiotics Other (See Comments)   Unsure last had as a child        Medication List     TAKE these medications    albuterol 108 (90 Base) MCG/ACT inhaler Commonly known as: VENTOLIN HFA Inhale 1-2 puffs into the lungs every 6 (six) hours as needed for wheezing or shortness of breath.   HYDROcodone-acetaminophen 5-325 MG tablet Commonly known as: NORCO/VICODIN Take 1 tablet by mouth every 6 (six) hours as needed for up to 3 days.   multivitamin-prenatal 27-0.8 MG Tabs tablet Take 1 tablet by mouth daily at 12 noon.   sertraline 50 MG  tablet Commonly known as: ZOLOFT TAKE 1 TABLET(50 MG) BY MOUTH DAILY        Follow-up Information     Carolan Shiver, MD Follow up on 07/01/2023.   Specialty: General Surgery Why: at 8:45 am, Follow up after cholecystectomy Contact information: [address] [place] [address] [phone]

## 2023-06-16 NOTE — Discharge Instructions (Addendum)

## 2023-06-16 NOTE — H&P (Signed)
 SURGICAL HISTORY AND PHYSICAL NOTE   HISTORY OF PRESENT ILLNESS (HPI):  38 y.o. female presented to [facility] ED for evaluation of abdominal pain. Patient reports pain started in the right upper quadrant not radiated to the epigastric area.  Pain woke patient up from sleep.  Patient endorses associated nausea.  No fever.  She cannot identify any alleviating or aggravating factors.  In the ED she was found with tenderness of palpation in the right upper quadrant.  Stable vital signs.  No fever.  Labs shows mild leukocytosis.  Normal bilirubin.  There is no significant electrolyte disturbance.  She had an ultrasound of the abdomen that shows gallbladder thickening with cholelithiasis.  It was consistent with acute cholecystitis.  I personally evaluated the images.  Surgery is consulted by Dr. Larinda Buttery in this context for evaluation and management of acute cholecystitis.  PAST MEDICAL HISTORY (PMH):  Past Medical History:  Diagnosis Date   Anemia    Asthma    well controlled   Asthma    Gestational diabetes    Headache    Migraines    Pregnancy-induced hypertension, unspecified trimester    Seizures ([facility])    psuedo     PAST SURGICAL HISTORY (PSH):  Past Surgical History:  Procedure Laterality Date   CESAREAN SECTION  2009   I & D EXTREMITY Right 08/24/2015   Procedure: IRRIGATION AND DEBRIDEMENT EXTREMITY;  Surgeon: Kieth Brightly, MD;  Location: [facility] ORS;  Service: General;  Laterality: Right;   LAPAROSCOPIC TUBAL LIGATION Bilateral 01/21/2022   Procedure: LAPAROSCOPIC BILATERAL TUBAL LIGATION;  Surgeon: Linzie Collin, MD;  Location: [facility] ORS;  Service: Gynecology;  Laterality: Bilateral;     MEDICATIONS:  Prior to Admission medications   Medication Sig Start Date End Date Taking? Authorizing Provider  albuterol (VENTOLIN HFA) 108 (90 Base) MCG/ACT inhaler Inhale 1-2 puffs into the lungs every 6 (six) hours as needed for wheezing or shortness of breath. 03/21/20  Yes Delton See, MD  Prenatal Vit-Fe Fumarate-FA (MULTIVITAMIN-PRENATAL) 27-0.8 MG TABS tablet Take 1 tablet by mouth daily at 12 noon. Patient not taking: Reported on 06/16/2023    [provider]  sertraline (ZOLOFT) 50 MG tablet TAKE 1 TABLET(50 MG) BY MOUTH DAILY Patient not taking: Reported on 06/16/2023 08/06/22   Linzie Collin, MD     ALLERGIES:  Allergies  Allergen Reactions   Sulfa Antibiotics Other (See Comments)    Unsure last had as a child     SOCIAL HISTORY:  Social History   Socioeconomic History   Marital status: Married    Spouse name: Aneta Mins   Number of children: Not on file   Years of education: Not on file   Highest education level: Not on file  Occupational History   Not on file  Tobacco Use   Smoking status: Former    Current packs/day: 0.00    Average packs/day: 0.3 packs/day for 14.0 years (3.5 ttl pk-yrs)    Types: Cigarettes    Start date: 04/12/2007    Quit date: 04/11/2021    Years since quitting: 2.1   Smokeless tobacco: Never  Vaping Use   Vaping status: Every Day   Substances: Nicotine  Substance and Sexual Activity   Alcohol use: Not Currently    Comment: social   Drug use: No   Sexual activity: Not Currently    Birth control/protection: Surgical    Comment: BTL  Other Topics Concern   Not on file  Social History Narrative  Not on file   Social Drivers of Health   Financial Resource Strain: Not on file  Food Insecurity: No Food Insecurity (12/09/2021)   Hunger Vital Sign    Worried About Running Out of Food in the Last Year: Never true    Ran Out of Food in the Last Year: Never true  Transportation Needs: No Transportation Needs (12/09/2021)   PRAPARE - Administrator, Civil Service (Medical): No    Lack of Transportation (Non-Medical): No  Physical Activity: Not on file  Stress: Not on file  Social Connections: Not on file  Intimate Partner Violence: Not on file     FAMILY HISTORY:  Family History  Problem  Relation Age of Onset   Heart block Father    Cervical cancer Sister    Breast cancer Maternal Aunt    Diabetes Maternal Grandmother    Diabetes Maternal Grandfather      REVIEW OF SYSTEMS:  Constitutional: denies weight loss, fever, chills, or sweats  Eyes: denies any other vision changes, history of eye injury  ENT: denies sore throat, hearing problems  Respiratory: denies shortness of breath, wheezing  Cardiovascular: denies chest pain, palpitations  Gastrointestinal: positive abdominal pain, nausea Genitourinary: denies burning with urination or urinary frequency Musculoskeletal: denies any other joint pains or cramps  Skin: denies any other rashes or skin discolorations  Neurological: denies any other headache, dizziness, weakness  Psychiatric: denies any other depression, anxiety   All other review of systems were negative   VITAL SIGNS:  Temp:  [98 F (36.7 C)-98.3 F (36.8 C)] 98 F (36.7 C) (04/07 1236) Pulse Rate:  [86-98] 86 (04/07 1236) Resp:  [16-18] 16 (04/07 1236) BP: (125-133)/(84-90) 125/87 (04/07 1236) SpO2:  [97 %-98 %] 97 % (04/07 1236) Weight:  [97.5 kg] 97.5 kg (04/07 0754)     Height: 5\' 1"  (154.9 cm) Weight: 97.5 kg BMI (Calculated): 40.64   INTAKE/OUTPUT:  This shift: No intake/output data recorded.  Last 2 shifts: @IOLAST2SHIFTS @   PHYSICAL EXAM:  Constitutional:  -- Normal body habitus  -- Awake, alert, and oriented x3  Eyes:  -- Pupils equally round and reactive to light  -- No scleral icterus  Ear, nose, and throat:  -- No jugular venous distension  Pulmonary:  -- No crackles  -- Equal breath sounds bilaterally -- Breathing non-labored at rest Cardiovascular:  -- S1, S2 present  -- No pericardial rubs Gastrointestinal:  -- Abdomen soft, tender in the right upper quadrant, non-distended, no guarding or rebound tenderness -- No abdominal masses appreciated, pulsatile or otherwise  Musculoskeletal and Integumentary:  -- Wounds: None  appreciated -- Extremities: B/L UE and LE FROM, hands and feet warm, no edema  Neurologic:  -- Motor function: intact and symmetric -- Sensation: intact and symmetric   Labs:     Latest Ref Rng & Units 06/16/2023    9:20 AM 12/24/2021    1:57 PM 12/10/2021    6:28 AM  CBC  WBC 4.0 - 10.5 K/uL 12.1  7.0  14.3   Hemoglobin 12.0 - 15.0 g/dL 16.1  09.6  9.8   Hematocrit 36.0 - 46.0 % 41.2  37.8  30.4   Platelets 150 - 400 K/uL 349  501  236       Latest Ref Rng & Units 06/16/2023    9:20 AM 12/24/2021    1:57 PM 12/09/2021   12:42 AM  CMP  Glucose 70 - 99 mg/dL 92  83  045  BUN 6 - 20 mg/dL 7  9  14    Creatinine 0.44 - 1.00 mg/dL 1.61  0.96  0.45   Sodium 135 - 145 mmol/L 136  141  136   Potassium 3.5 - 5.1 mmol/L 3.8  4.4  3.9   Chloride 98 - 111 mmol/L 106  107  111   CO2 22 - 32 mmol/L 20  21  18    Calcium 8.9 - 10.3 mg/dL 8.8  9.2  9.1   Total Protein 6.5 - 8.1 g/dL 6.7  6.4  6.9   Total Bilirubin 0.0 - 1.2 mg/dL 0.4  0.2  0.3   Alkaline Phos 38 - 126 U/L 70  162  339   AST 15 - 41 U/L 16  20  18    ALT 0 - 44 U/L 13  30  7      Imaging studies:  EXAM: ULTRASOUND ABDOMEN LIMITED RIGHT UPPER QUADRANT   COMPARISON:  None available   FINDINGS: Gallbladder:   Gallstones: 3.2 cm gallstone seen within the gallbladder lumen   Sludge: Present   Gallbladder Wall: Mildly thickened measuring up to 5 mm.   Pericholecystic fluid: None   Sonographic Murphy's Sign: Negative per technologist   Common bile duct:   Diameter: 5 mm   Liver:   Parenchymal echogenicity: Mildly increased   Contours: Normal   Lesions: None   Portal vein: Patent.  Hepatopetal flow   Other: None.   IMPRESSION: 1. Cholelithiasis with mild gallbladder wall thickening, may be due to mild or early cholecystitis. If there is high clinical suspicion for cholecystitis, further evaluation with HIDA scan should be performed. 2. Diffuse increased echogenicity of the hepatic parenchyma is  a nonspecific indicator of hepatocellular dysfunction, most commonly steatosis.     Electronically Signed   By: Acquanetta Belling M.D.   On: 06/16/2023 10:33    Assessment/Plan:  38 y.o. female with acute cholecystitis.  Patient with history, physical exam and images consistent with acute cholecystitis. Patient oriented about diagnosis and surgical management as treatment.   Discussed the risk of surgery including post-op infxn, seroma, biloma, chronic pain, poor-delayed wound healing, retained gallstone, conversion to open procedure, post-op SBO or ileus, and need for additional procedures to address said risks.  The risks of general anesthetic including MI, CVA, sudden death or even reaction to anesthetic medications also discussed. Alternatives include continued observation.  Benefits include possible symptom relief, prevention of complications including acute cholecystitis, pancreatitis.  Gae Gallop, MD

## 2023-06-16 NOTE — Op Note (Signed)
 Preoperative diagnosis: Acute cholecystitis  Postoperative diagnosis: Acute cholecystitis  Procedure: Robotic Assisted Laparoscopic Cholecystectomy.   Anesthesia: GETA   Surgeon: Dr. Hazle Quant  Wound Classification: Clean Contaminated  Indications: Patient is a 38 y.o. female developed right upper quadrant pain, nausea, leukocytosis and on workup was found to have cholelithiasis with a normal common duct with cholecystitis. Robotic Assisted Laparoscopic cholecystectomy was elected.  Findings: Severe pericholecystic edema Cystic duct and artery identified, ligated and divided Adequate hemostasis  Description of procedure: The patient was placed on the operating table in the supine position. General anesthesia was induced. A time-out was completed verifying correct patient, procedure, site, positioning, and implant(s) and/or special equipment prior to beginning this procedure. An orogastric tube was placed. The abdomen was prepped and draped in the usual sterile fashion.  An incision was made in a natural skin line below the umbilicus.  The fascia was elevated and the Veress needle inserted. Proper position was confirmed by aspiration and saline meniscus test.  The abdomen was insufflated with carbon dioxide to a pressure of 15 mmHg. The patient tolerated insufflation well. A 8-mm trocar was then inserted in optiview fashion.  The laparoscope was inserted and the abdomen inspected. No injuries from initial trocar placement were noted. Additional trocars were then inserted in the following locations: an 8-mm trocar in the left lateral abdomen, and another two 8-mm trocars to the right side of the abdomen 5 cm appart. The umbilical trocar was changed to a 12 mm trocar all under direct visualization. The abdomen was inspected and no abnormalities were found. The table was placed in the reverse Trendelenburg position with the right side up. The robotic arms were docked and target anatomy  identified. Instrument inserted under direct visualization.  Filmy adhesions between the gallbladder and omentum, duodenum and transverse colon were lysed with electrocautery. The gallbladder was found to be dilated and tense. The dome of the gallbladder was grasped with a prograsp and retracted over the dome of the liver. The infundibulum was also grasped with an atraumatic grasper and retracted toward the right lower quadrant. This maneuver exposed Calot's triangle. The peritoneum overlying the gallbladder infundibulum was very scared with the inflammation and the critical view of safety was not able to be obtained.  A very difficulty and time consuming dissection from dome down was done until cystic duct and artery were identified. Firefly images taken to visualize biliary ducts. The cystic duct and cystic artery were then doubly clipped and divided close to the gallbladder.  The gallbladder was then dissected from its peritoneal attachments by electrocautery. Hemostasis was checked and the gallbladder and contained stones were removed using an endoscopic retrieval bag. The gallbladder was passed off the table as a specimen. There was no evidence of bleeding from the gallbladder fossa or cystic artery or leakage of the bile from the cystic duct stump. Secondary trocars were removed under direct vision. No bleeding was noted. The robotic arms were undoked. The scope was withdrawn and the umbilical trocar removed. The abdomen was allowed to collapse. The fascia of the 12mm trocar sites was closed with figure-of-eight 0 vicryl sutures. The skin was closed with subcuticular sutures of 4-0 monocryl and topical skin adhesive. The orogastric tube was removed.  The patient tolerated the procedure well and was taken to the postanesthesia care unit in stable condition.   Specimen: Gallbladder  Complications: None  EBL: 10 mL

## 2023-06-16 NOTE — ED Triage Notes (Signed)
 Patient c/o upper abdominal pain and chest pain. Reports burning and nausea. Symptoms since sunday

## 2023-06-16 NOTE — ED Provider Notes (Signed)
 [facility] Provider Note    Event Date/Time   First MD Initiated Contact with Patient 06/16/23 [phone]     (approximate)   History   Chief Complaint Abdominal Pain and Chest Pain   HPI  Yvonne Reid is a 38 y.o. female with past medical history of asthma and anemia who presents to the ED complaining of abdominal pain.  Patient reports that she has been dealing with pain in her epigastrium constantly for about the past 24 hours.  She describes it as a burning pain that will occasionally extend up into her chest when it is severe.  This has been associated with nausea and a couple episodes of vomiting, she denies any changes in her bowel movements. She has not had any fevers, dysuria, or flank pain.  She denies any chest pain currently and has not had any cough or shortness of breath.     Physical Exam   Triage Vital Signs: ED Triage Vitals  Encounter Vitals Group     BP 06/16/23 0753 (!) 133/90     Systolic BP Percentile --      Diastolic BP Percentile --      Pulse Rate 06/16/23 0753 98     Resp 06/16/23 0753 18     Temp 06/16/23 0753 98.3 F (36.8 C)     Temp Source 06/16/23 0753 Oral     SpO2 06/16/23 0753 98 %     Weight 06/16/23 0754 215 lb (97.5 kg)     Height 06/16/23 0754 5\' 1"  (1.549 m)     Head Circumference --      Peak Flow --      Pain Score 06/16/23 0754 7     Pain Loc --      Pain Education --      Exclude from Growth Chart --     Most recent vital signs: Vitals:   06/16/23 0753  BP: (!) 133/90  Pulse: 98  Resp: 18  Temp: 98.3 F (36.8 C)  SpO2: 98%    Constitutional: Alert and oriented. Eyes: Conjunctivae are normal. Head: Atraumatic. Nose: No congestion/rhinnorhea. Mouth/Throat: Mucous membranes are moist.  Cardiovascular: Normal rate, regular rhythm. Grossly normal heart sounds.  2+ radial pulses bilaterally. Respiratory: Normal respiratory effort.  No retractions. Lungs CTAB. Gastrointestinal: Soft and tender  to palpation in the epigastrium and right upper quadrant. No distention. Musculoskeletal: No lower extremity tenderness nor edema.  Neurologic:  Normal speech and language. No gross focal neurologic deficits are appreciated.    ED Results / Procedures / Treatments   Labs (all labs ordered are listed, but only abnormal results are displayed) Labs Reviewed  CBC - Abnormal; Notable for the following components:      Result Value   WBC 12.1 (*)    All other components within normal limits  COMPREHENSIVE METABOLIC PANEL WITH GFR - Abnormal; Notable for the following components:   CO2 20 (*)    Calcium 8.8 (*)    All other components within normal limits  LIPASE, BLOOD  PREGNANCY, URINE  TROPONIN I (HIGH SENSITIVITY)     EKG  ED ECG REPORT I, Chesley Noon, the attending physician, personally viewed and interpreted this ECG.   Date: 06/16/2023  EKG Time: 8:07  Rate: 90  Rhythm: normal sinus rhythm  Axis: RAD  Intervals:none  ST&T Change: None  RADIOLOGY Chest x-ray reviewed and interpreted by me with no infiltrate, edema, or effusion.  PROCEDURES:  Critical Care performed: No  Procedures   MEDICATIONS ORDERED IN ED: Medications  morphine (PF) 4 MG/ML injection 4 mg (has no administration in time range)  piperacillin-tazobactam (ZOSYN) IVPB 3.375 g (has no administration in time range)  alum & mag hydroxide-simeth (MAALOX/MYLANTA) 200-200-20 MG/5ML suspension 30 mL (30 mLs Oral Given 06/16/23 0936)    And  lidocaine (XYLOCAINE) 2 % viscous mouth solution 15 mL (15 mLs Oral Given 06/16/23 0936)     IMPRESSION / MDM / ASSESSMENT AND PLAN / ED COURSE  I reviewed the triage vital signs and the nursing notes.                              38 y.o. female with past medical history of asthma and anemia who presents to the ED complaining of 24 hours of constant burning pain in her epigastrium associate with nausea and vomiting.  Patient's presentation is most consistent  with acute presentation with potential threat to life or bodily function.  Differential diagnosis includes, but is not limited to, ACS, pneumonia, gastritis, pancreatitis, hepatitis, cholecystitis, biliary colic, ectopic pregnancy, UTI.  Patient uncomfortable but nontoxic-appearing and in no acute distress, vital signs are unremarkable.  Her abdomen is soft but she does have tenderness in her epigastrium and right upper quadrant, will further assess with right upper quadrant ultrasound.  Pain moves up into her chest but seems atypical for ACS, EKG shows no evidence arrhythmia or ischemia and troponin pending at this time.  Chest x-ray shows questionable pneumonia, however this seems unlikely as patient denies any fevers, cough, or difficulty breathing.  Thoracic symptoms seem much more likely to be related to esophagitis given burning discomfort in her epigastrium.  We will treat with GI cocktail and reassess.  Labs with mild leukocytosis but no significant anemia, electrolyte abnormality, or AKI.  LFTs and lipase are unremarkable, troponin within normal limits.  Patient does have significant pain on reassessment following GI cocktail, right upper quadrant ultrasound concerning for cholecystitis given gallstones and gallbladder wall thickening.  We will give IV morphine and Case discussed with Dr. Maia Plan of general surgery, who will plan to take patient to the operating room later this afternoon.      FINAL CLINICAL IMPRESSION(S) / ED DIAGNOSES   Final diagnoses:  Cholecystitis     Rx / DC Orders   ED Discharge Orders     None        Note:  This document was prepared using Dragon voice recognition software and may include unintentional dictation errors.   Chesley Noon, MD 06/16/23 1133

## 2023-06-16 NOTE — Anesthesia Preprocedure Evaluation (Addendum)
 Anesthesia Evaluation  Patient identified by MRN, date of birth, ID band Patient awake    Reviewed: Allergy & Precautions, H&P , NPO status , Patient's Chart, lab work & pertinent test results, reviewed documented beta blocker date and time   History of Anesthesia Complications Negative for: history of anesthetic complications  Airway Mallampati: IV  TM Distance: >3 FB Neck ROM: full    Dental  (+) Dental Advidsory Given, Teeth Intact, Missing Permanent bridge on bottom right:   Pulmonary neg shortness of breath, asthma , neg sleep apnea, neg COPD, neg recent URI, former smoker   Pulmonary exam normal breath sounds clear to auscultation       Cardiovascular Exercise Tolerance: Good hypertension, Normal cardiovascular exam Rhythm:regular Rate:Normal     Neuro/Psych  Headaches, Seizures - (pseudo),   negative psych ROS   GI/Hepatic negative GI ROS, Neg liver ROS,,,  Endo/Other  diabetes  Class 3 obesity  Renal/GU negative Renal ROS  negative genitourinary   Musculoskeletal   Abdominal   Peds  Hematology negative hematology ROS (+)   Anesthesia Other Findings Past Medical History: No date: Anemia No date: Asthma     Comment:  well controlled No date: Asthma No date: Gestational diabetes No date: Headache No date: Migraines No date: Pregnancy-induced hypertension, unspecified trimester No date: Seizures (HCC)     Comment:  psuedo   Reproductive/Obstetrics negative OB ROS                              Anesthesia Physical Anesthesia Plan  ASA: 2  Anesthesia Plan: General   Post-op Pain Management:    Induction: Intravenous  PONV Risk Score and Plan: 3 and Ondansetron, Dexamethasone, Midazolam and Treatment may vary due to age or medical condition  Airway Management Planned: Oral ETT  Additional Equipment:   Intra-op Plan:   Post-operative Plan: Extubation in OR  Informed  Consent: I have reviewed the patients History and Physical, chart, labs and discussed the procedure including the risks, benefits and alternatives for the proposed anesthesia with the patient or authorized representative who has indicated his/her understanding and acceptance.     Dental Advisory Given  Plan Discussed with: Anesthesiologist, CRNA and Surgeon  Anesthesia Plan Comments:         Anesthesia Quick Evaluation

## 2023-06-18 LAB — SURGICAL PATHOLOGY

## 2023-06-24 NOTE — Anesthesia Postprocedure Evaluation (Signed)
 Anesthesia Post Note  Patient: Yvonne Reid  Procedure(s) Performed: CHOLECYSTECTOMY, ROBOT-ASSISTED, LAPAROSCOPIC (Abdomen)  Patient location during evaluation: PACU Anesthesia Type: General Level of consciousness: awake and alert Pain management: pain level controlled Vital Signs Assessment: post-procedure vital signs reviewed and stable Respiratory status: spontaneous breathing, nonlabored ventilation, respiratory function stable and patient connected to nasal cannula oxygen Cardiovascular status: blood pressure returned to baseline and stable Postop Assessment: no apparent nausea or vomiting Anesthetic complications: no   No notable events documented.   Last Vitals:  Vitals:   06/16/23 1630 06/16/23 1646  BP: 122/80   Pulse: 95 100  Resp: 18 20  Temp: 36.9 C 37 C  SpO2: 96% 97%    Last Pain:  Vitals:   06/16/23 1658  TempSrc:   PainSc: 9                  Zula Hitch
